# Patient Record
Sex: Female | Born: 1990 | Race: Black or African American | Hispanic: No | Marital: Single | Smoking: Never smoker
Health system: Southern US, Community
[De-identification: ages and names within clinical notes are randomized; demographics above are authoritative.]

## PROBLEM LIST (undated history)

## (undated) ENCOUNTER — Inpatient Hospital Stay (HOSPITAL_COMMUNITY): Payer: Self-pay

## (undated) DIAGNOSIS — A749 Chlamydial infection, unspecified: Secondary | ICD-10-CM

## (undated) HISTORY — PX: DILATION AND CURETTAGE OF UTERUS: SHX78

---

## 2009-08-18 DIAGNOSIS — A749 Chlamydial infection, unspecified: Secondary | ICD-10-CM

## 2009-08-18 HISTORY — DX: Chlamydial infection, unspecified: A74.9

## 2010-09-14 ENCOUNTER — Inpatient Hospital Stay (HOSPITAL_COMMUNITY)
Admission: AD | Admit: 2010-09-14 | Discharge: 2010-09-14 | Payer: Self-pay | Source: Home / Self Care | Attending: Obstetrics & Gynecology | Admitting: Obstetrics & Gynecology

## 2010-09-14 LAB — CBC
MCH: 27 pg (ref 26.0–34.0)
MCHC: 31.7 g/dL (ref 30.0–36.0)
Platelets: 217 10*3/uL (ref 150–400)
RDW: 12.4 % (ref 11.5–15.5)

## 2010-09-14 LAB — URINALYSIS, ROUTINE W REFLEX MICROSCOPIC
Ketones, ur: NEGATIVE mg/dL
Nitrite: NEGATIVE
Specific Gravity, Urine: 1.025 (ref 1.005–1.030)
Urine Glucose, Fasting: NEGATIVE mg/dL
pH: 7.5 (ref 5.0–8.0)

## 2010-09-14 LAB — HCG, QUANTITATIVE, PREGNANCY: hCG, Beta Chain, Quant, S: 53745 m[IU]/mL — ABNORMAL HIGH (ref <5)

## 2010-09-14 LAB — URINE MICROSCOPIC-ADD ON

## 2010-09-14 LAB — POCT PREGNANCY, URINE: Preg Test, Ur: POSITIVE

## 2010-09-14 LAB — ABO/RH: ABO/RH(D): A POS

## 2010-09-14 LAB — WET PREP, GENITAL: Yeast Wet Prep HPF POC: NONE SEEN

## 2010-09-16 LAB — URINE CULTURE

## 2010-09-16 LAB — GC/CHLAMYDIA PROBE AMP, GENITAL
Chlamydia, DNA Probe: NEGATIVE
GC Probe Amp, Genital: NEGATIVE

## 2010-09-23 ENCOUNTER — Inpatient Hospital Stay (HOSPITAL_COMMUNITY)
Admission: AD | Admit: 2010-09-23 | Discharge: 2010-09-23 | Disposition: A | Discharge status: Home / Self Care | Payer: Medicaid Other | Source: Ambulatory Visit | Attending: Obstetrics & Gynecology | Admitting: Obstetrics & Gynecology

## 2010-09-23 DIAGNOSIS — O9989 Other specified diseases and conditions complicating pregnancy, childbirth and the puerperium: Secondary | ICD-10-CM

## 2010-09-23 DIAGNOSIS — H6690 Otitis media, unspecified, unspecified ear: Secondary | ICD-10-CM | POA: Insufficient documentation

## 2010-09-23 DIAGNOSIS — O99891 Other specified diseases and conditions complicating pregnancy: Secondary | ICD-10-CM | POA: Insufficient documentation

## 2013-08-18 NOTE — L&D Delivery Note (Signed)
Delivery Note About 45 minutes after AROM, pt developed an urge to push, and was found to be C/C/+3. Her epidural was working well. After one push, att 8:53 PM a viable female was delivered via Vaginal, Spontaneous Delivery (Presentation: Left Occiput Anterior).  APGAR: 9, 9; weight 6 lb 4.5 oz (2849 g). 40 units of pitocin diluted in 1000cc LR was infused rapidly IV.  The placenta separated spontaneously and delivered via CCT and maternal pushing effort.  It was inspected and appears to be intact with a 3 VC.    Anesthesia: Epidural  Episiotomy: None Lacerations: None Suture Repair: n/a Est. Blood Loss (mL): 300  Mom to postpartum.  Baby to Couplet care / Skin to Skin.  CRESENZO-DISHMAN,Geordan Xu 08/15/2014, 10:29 PM

## 2014-03-19 ENCOUNTER — Encounter (HOSPITAL_COMMUNITY): Payer: Self-pay | Admitting: *Deleted

## 2014-03-19 ENCOUNTER — Inpatient Hospital Stay (HOSPITAL_COMMUNITY)
Admission: AD | Admit: 2014-03-19 | Discharge: 2014-03-19 | Disposition: A | Discharge status: Home / Self Care | Payer: Medicaid Other | Source: Ambulatory Visit | Attending: Obstetrics and Gynecology | Admitting: Obstetrics and Gynecology

## 2014-03-19 DIAGNOSIS — Z8249 Family history of ischemic heart disease and other diseases of the circulatory system: Secondary | ICD-10-CM | POA: Diagnosis not present

## 2014-03-19 DIAGNOSIS — O26892 Other specified pregnancy related conditions, second trimester: Secondary | ICD-10-CM

## 2014-03-19 DIAGNOSIS — R519 Headache, unspecified: Secondary | ICD-10-CM

## 2014-03-19 DIAGNOSIS — R51 Headache: Secondary | ICD-10-CM | POA: Diagnosis present

## 2014-03-19 DIAGNOSIS — Z833 Family history of diabetes mellitus: Secondary | ICD-10-CM | POA: Insufficient documentation

## 2014-03-19 DIAGNOSIS — O99891 Other specified diseases and conditions complicating pregnancy: Secondary | ICD-10-CM | POA: Diagnosis not present

## 2014-03-19 DIAGNOSIS — O9989 Other specified diseases and conditions complicating pregnancy, childbirth and the puerperium: Secondary | ICD-10-CM

## 2014-03-19 LAB — URINALYSIS, ROUTINE W REFLEX MICROSCOPIC
BILIRUBIN URINE: NEGATIVE
Glucose, UA: NEGATIVE mg/dL
HGB URINE DIPSTICK: NEGATIVE
Ketones, ur: NEGATIVE mg/dL
Leukocytes, UA: NEGATIVE
NITRITE: NEGATIVE
PH: 7 (ref 5.0–8.0)
Protein, ur: NEGATIVE mg/dL
SPECIFIC GRAVITY, URINE: 1.02 (ref 1.005–1.030)
Urobilinogen, UA: 1 mg/dL (ref 0.0–1.0)

## 2014-03-19 LAB — POCT PREGNANCY, URINE: PREG TEST UR: POSITIVE — AB

## 2014-03-19 NOTE — Discharge Instructions (Signed)
Take Tylenol 325 mg 2 tablets by mouth every 4 hours if needed for pain. °Drink at least 8 8-oz glasses of water every day. °No smoking, no drugs, no alcohol.  Take a prenatal vitamin one by mouth every day.  Eat small frequent snacks to avoid nausea.  Begin prenatal care as soon as possible. ° °

## 2014-03-19 NOTE — MAU Provider Note (Signed)
History     CSN: [id number]  Arrival date and time: 03/19/14 1541   First Provider Initiated Contact with Patient 03/19/14 1604      Chief Complaint  Patient presents with  . Headache  . Possible Pregnancy   HPI Alicia Craig 23 y.o. 2938w0d  Comes to MAU as she has a mild headache today.  Has been having lots of headaches recently.  Has taken tylenol or motrin for her headaches.  Hs of Irregular menses.  Had not considered that she might be pregnant.  Was surprised by the positive pregnancy test.  Headache today is mild.  Has not taken any pain medication today and has not eaten today.   OB History   Grav Para Term Preterm Abortions TAB SAB Ect Mult Living   3 2 2       2       Past Medical History  Diagnosis Date  . Medical history non-contributory     Past Surgical History  Procedure Laterality Date  . No past surgeries      Family History  Problem Relation Age of Onset  . Hypertension Mother   . Diabetes Maternal Grandmother     History  Substance Use Topics  . Smoking status: Never Smoker   . Smokeless tobacco: Not on file  . Alcohol Use: No    Allergies: Allergies not on file  No prescriptions prior to admission    Review of Systems  Constitutional: Negative for fever.  Gastrointestinal: Negative for nausea, vomiting and abdominal pain.  Genitourinary:       No vaginal discharge. No vaginal bleeding. No dysuria.  Neurological: Positive for headaches.   Physical Exam   Blood pressure 125/64, pulse 91, temperature 98.3 F (36.8 C), temperature source Oral, resp. rate 18, height 5\' 2"  (1.575 m), weight 170 lb (77.111 kg), last menstrual period 10/30/2013.  Physical Exam  Nursing note and vitals reviewed. Constitutional: She is oriented to person, place, and time. She appears well-developed and well-nourished.  HENT:  Head: Normocephalic.  Eyes: EOM are normal.  Neck: Neck supple.  GI: Soft. There is no tenderness.  Fundus just below the  umbilicus.  FHT 150 with the doppler.  Musculoskeletal: Normal range of motion.  Neurological: She is alert and oriented to person, place, and time.  Skin: Skin is warm and dry.  Psychiatric: She has a normal mood and affect.    MAU Course  Procedures Results for orders placed during the hospital encounter of 03/19/14 (from the past 24 hour(s))  URINALYSIS, ROUTINE W REFLEX MICROSCOPIC     Status: None   Collection Time    03/19/14  3:40 PM      Result Value Ref Range   Color, Urine YELLOW  YELLOW   APPearance CLEAR  CLEAR   Specific Gravity, Urine 1.020  1.005 - 1.030   pH 7.0  5.0 - 8.0   Glucose, UA NEGATIVE  NEGATIVE mg/dL   Hgb urine dipstick NEGATIVE  NEGATIVE   Bilirubin Urine NEGATIVE  NEGATIVE   Ketones, ur NEGATIVE  NEGATIVE mg/dL   Protein, ur NEGATIVE  NEGATIVE mg/dL   Urobilinogen, UA 1.0  0.0 - 1.0 mg/dL   Nitrite NEGATIVE  NEGATIVE   Leukocytes, UA NEGATIVE  NEGATIVE  POCT PREGNANCY, URINE     Status: Abnormal   Collection Time    03/19/14  3:50 PM      Result Value Ref Range   Preg Test, Ur POSITIVE (*) NEGATIVE  MDM Declines pain medication today.  Will take tylenol at home.  States she was approved for Medicaid but does not have her card.  Wants to go to a private doctor.  Advised to get card and call for an appointment.  Assessment and Plan  Headaches in pregnancy pregnancy 19-20 weeks  Plan Take Tylenol 325 mg 2 tablets by mouth every 4 hours if needed for pain. Drink at least 8 8-oz glasses of water every day. No smoking, no drugs, no alcohol.  Take a prenatal vitamin one by mouth every day.  Eat small frequent snacks to avoid nausea.  Begin prenatal care as soon as possible.    Raylan Hanton 03/19/2014, 4:15 PM

## 2014-03-19 NOTE — MAU Note (Signed)
C/o hot flashes and headache today; headache & hot flashes have been intermittent for past 2 weeks; unsure of LNMP; positive UPT today;

## 2014-03-19 NOTE — MAU Provider Note (Signed)
Attestation of Attending Supervision of Advanced Practitioner (CNM/NP): Evaluation and management procedures were performed by the Advanced Practitioner under my supervision and collaboration.  I have reviewed the Advanced Practitioner's note and chart, and I agree with the management and plan.  Mayumi Summerson 03/19/2014 8:15 PM

## 2014-04-20 ENCOUNTER — Other Ambulatory Visit (HOSPITAL_COMMUNITY): Payer: Self-pay | Admitting: Urology

## 2014-04-20 DIAGNOSIS — Z3689 Encounter for other specified antenatal screening: Secondary | ICD-10-CM

## 2014-04-20 LAB — OB RESULTS CONSOLE HEPATITIS B SURFACE ANTIGEN: Hepatitis B Surface Ag: NEGATIVE

## 2014-04-20 LAB — OB RESULTS CONSOLE GC/CHLAMYDIA
Chlamydia: NEGATIVE
GC PROBE AMP, GENITAL: NEGATIVE

## 2014-04-20 LAB — OB RESULTS CONSOLE HIV ANTIBODY (ROUTINE TESTING): HIV: NONREACTIVE

## 2014-04-20 LAB — OB RESULTS CONSOLE RPR: RPR: NONREACTIVE

## 2014-04-20 LAB — OB RESULTS CONSOLE ABO/RH: RH Type: POSITIVE

## 2014-04-20 LAB — OB RESULTS CONSOLE ANTIBODY SCREEN: ANTIBODY SCREEN: NEGATIVE

## 2014-04-20 LAB — OB RESULTS CONSOLE RUBELLA ANTIBODY, IGM: RUBELLA: IMMUNE

## 2014-04-27 ENCOUNTER — Ambulatory Visit (HOSPITAL_COMMUNITY)
Admission: RE | Admit: 2014-04-27 | Discharge: 2014-04-27 | Disposition: A | Discharge status: Home / Self Care | Payer: Medicaid Other | Source: Ambulatory Visit | Attending: Urology | Admitting: Urology

## 2014-04-27 ENCOUNTER — Encounter (HOSPITAL_COMMUNITY): Payer: Self-pay

## 2014-04-27 ENCOUNTER — Ambulatory Visit (HOSPITAL_COMMUNITY): Admission: RE | Admit: 2014-04-27 | Payer: Medicaid Other | Source: Ambulatory Visit

## 2014-04-27 ENCOUNTER — Ambulatory Visit (HOSPITAL_COMMUNITY)
Admission: RE | Admit: 2014-04-27 | Discharge: 2014-04-27 | Disposition: A | Discharge status: Home / Self Care | Payer: Medicaid Other | Source: Ambulatory Visit | Attending: Physician Assistant | Admitting: Physician Assistant

## 2014-04-27 VITALS — BP 106/57 | HR 89 | Wt 199.0 lb

## 2014-04-27 DIAGNOSIS — O0930 Supervision of pregnancy with insufficient antenatal care, unspecified trimester: Secondary | ICD-10-CM | POA: Diagnosis not present

## 2014-04-27 DIAGNOSIS — O352XX0 Maternal care for (suspected) hereditary disease in fetus, not applicable or unspecified: Secondary | ICD-10-CM | POA: Insufficient documentation

## 2014-04-27 DIAGNOSIS — O09299 Supervision of pregnancy with other poor reproductive or obstetric history, unspecified trimester: Secondary | ICD-10-CM

## 2014-04-27 DIAGNOSIS — O0932 Supervision of pregnancy with insufficient antenatal care, second trimester: Secondary | ICD-10-CM

## 2014-04-27 DIAGNOSIS — Z3689 Encounter for other specified antenatal screening: Secondary | ICD-10-CM

## 2014-04-27 DIAGNOSIS — O352XX1 Maternal care for (suspected) hereditary disease in fetus, fetus 1: Secondary | ICD-10-CM

## 2014-05-09 ENCOUNTER — Encounter (HOSPITAL_COMMUNITY): Payer: Self-pay

## 2014-05-09 DIAGNOSIS — O09299 Supervision of pregnancy with other poor reproductive or obstetric history, unspecified trimester: Secondary | ICD-10-CM | POA: Insufficient documentation

## 2014-05-09 NOTE — Progress Notes (Addendum)
Genetic Counseling  High-Risk Gestation Note  Appointment Date:  04/27/2014 Referred By: Quentin Mulling, PA-C Date of Birth:  09/13/1990    Pregnancy History: [id number] Estimated Date of Delivery: 08/18/14 Estimated Gestational Age: [redacted]w[redacted]d Attending: Alpha Gula, MD   I met with Ms. Ernestina Penna for genetic counseling because of a previous daughter with Down syndrome.   We began by reviewing the family history in detail. Ms. CZARINA GINGRAS reported that she has a 38 year old daughter who has Down syndrome. She reported that her daughter was low birth weight and was found to have an atrial septal defect and patent ductus arteriosus postnatally. Her daughter was born at [facility] and was diagnosed postnatally with Down syndrome. She reported that her daughter is currently followed by [facility] and has surgery scheduled for 05/01/14. The patient reported that she has not seen medical genetics since moving to this area. We provided Ms. Mcglamery with information regarding local Down syndrome support group through the [employer] [facility] [place] and discussed that a referral to medical genetics could be made by her daughter's pediatrician, if desired. We also provided Ms. Cumbee with written information regarding Down syndrome from the National Down syndrome Congress.   Ms. Murfin did not have information today regarding the specific type of Down syndrome determined from karyotype analysis.  She gave [place] written permission to obtain and review her daughter's medical records to confirm the type of Down syndrome in order to more accurately assess recurrence risk for the current pregnancy. Medical records request is currently pending.   We reviewed chromosomes, nondisjunction, and the associated 1 in 890 risk for fetal Down syndrome related to a maternal age of 23 y.o. at [redacted]w[redacted]d gestation. We discussed that 95% of cases of Down syndrome are not  inherited and are the result of non-disjunction.  Three to 4% of cases of Down syndrome are the result of a translocation involving chromosome #21.  We discussed that information regarding the etiology of her daughter's Down syndrome is needed to accurately assess recurrence risk for the current pregnancy. In the case that her daughter has trisomy 7, we reviewed that literature suggests that the risk of recurrence for a homo/heterotrisomy depends on the maternal age at the index pregnancy, the maternal age at the time of the subsequent pregnancy, and the specific trisomy [place] 2004).  Considering Ms. Ramsay's age at delivery for her daughter and her current age, her adjusted risk for Down syndrome (homotrisomy) in the current pregnancy would be approximately 1 in 80 (in the case that her daughter has trisomy 69).  The overall risk for any aneuploidy, considering Ms. Kujawa's maternal age of 23 y.o. and her history of a previous child with Down syndrome, is estimated to be approximately 2%, in the case that her daughter has trisomy 25. If her daughter has translocation Down syndrome, recurrence risk would depend upon the specific chromosomes involved in the translocation. Additional information regarding her daughter's karyotype will further refine recurrence risk assessment for the current pregnancy.    We reviewed available screening options including noninvasive prenatal screening (NIPS)/cell free DNA (cfDNA) and detailed ultrasound.  We reviewed the benefits and limitations of these tests including the detection rates, false positive rates, and the specific conditions for which each screens.  She understands that screening tests provide a pregnancy specific risk for specific conditions, but are not considered to be diagnostic. We reviewed the detailed ultrasound that was performed at the time  of today's visit. Visualized fetal anatomy appeared normal, and no markers of fetal aneuploidy were  visualized at this time. Complete ultrasound results reported separately.  Ms. Guin was also counseled regarding diagnostic testing via amniocentesis.  We reviewed the approximate 1 in [phone] risk for complications, including spontaneous pregnancy loss. After careful consideration, Ms. Dasher declined NIPS and amniocentesis in the pregnancy.   The family histories were otherwise found to be noncontributory for birth defects, intellectual disability, and known genetic conditions. Without further information regarding the provided family history, an accurate genetic risk cannot be calculated. Further genetic counseling is warranted if more information is obtained.   Ms. EDESSA JAKUBOWICZ denied exposure to environmental toxins or chemical agents. She denied the use of alcohol, tobacco or street drugs. She denied significant viral illnesses during the course of her pregnancy. Her medical and surgical histories were noncontributory.   I counseled Ms. Nicola Girt Oleary regarding the above risks and available options.  The approximate face-to-face time with the genetic counselor was 40 minutes.  Chipper Oman, MS Certified Genetic Counselor 05/09/2014    Addendum: 05/11/2014   Received follow-up information from the medical records for the patient's daughter, Lilia Pro. Documentation from Morgan's last genetic visit with [facility] states:  "Lilia Pro is a 52 week old female infant who is being seen in genetics clinic as follow-up from an inpatient consult because of features sggestive of Down syndrome. 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 This form of Down syndrome can be caused by one parent having a Robertsonian translocation. If one parent does have this translocaiton, that parent would only be able to have children with Down syndrome or miscarriages. Since Morgan's mother has another child withoutt Down syndrome, she would not  be a carrier. We would recommend that the biologic father have a chromosome karyotype."  Given this reported information and given that the current pregnancy reportedly has a different father from [place], the pregnancy would not be expected to be at increased risk for Down syndrome above the patient's age related risk.   Chipper Oman, MS Insurance risk surveyor

## 2014-06-19 ENCOUNTER — Encounter (HOSPITAL_COMMUNITY): Payer: Self-pay

## 2014-06-26 ENCOUNTER — Encounter (HOSPITAL_COMMUNITY): Payer: Self-pay | Admitting: *Deleted

## 2014-06-26 ENCOUNTER — Inpatient Hospital Stay (HOSPITAL_COMMUNITY)
Admission: AD | Admit: 2014-06-26 | Discharge: 2014-06-26 | Disposition: A | Discharge status: Home / Self Care | Payer: Medicaid Other | Source: Ambulatory Visit | Attending: Family Medicine | Admitting: Family Medicine

## 2014-06-26 DIAGNOSIS — R109 Unspecified abdominal pain: Secondary | ICD-10-CM | POA: Diagnosis present

## 2014-06-26 DIAGNOSIS — O479 False labor, unspecified: Secondary | ICD-10-CM

## 2014-06-26 DIAGNOSIS — Z3A32 32 weeks gestation of pregnancy: Secondary | ICD-10-CM | POA: Diagnosis not present

## 2014-06-26 DIAGNOSIS — O4703 False labor before 37 completed weeks of gestation, third trimester: Secondary | ICD-10-CM | POA: Insufficient documentation

## 2014-06-26 DIAGNOSIS — K5900 Constipation, unspecified: Secondary | ICD-10-CM | POA: Diagnosis not present

## 2014-06-26 LAB — URINALYSIS, ROUTINE W REFLEX MICROSCOPIC
Bilirubin Urine: NEGATIVE
GLUCOSE, UA: NEGATIVE mg/dL
Hgb urine dipstick: NEGATIVE
Ketones, ur: NEGATIVE mg/dL
LEUKOCYTES UA: NEGATIVE
NITRITE: NEGATIVE
PH: 8.5 — AB (ref 5.0–8.0)
PROTEIN: NEGATIVE mg/dL
Specific Gravity, Urine: 1.015 (ref 1.005–1.030)
Urobilinogen, UA: 1 mg/dL (ref 0.0–1.0)

## 2014-06-26 MED ORDER — SENNOSIDES-DOCUSATE SODIUM 8.6-50 MG PO TABS: 2.0000 | ORAL_TABLET | Freq: Two times a day (BID) | ORAL | Status: DC | PRN

## 2014-06-26 NOTE — MAU Provider Note (Signed)
Chief Complaint:  Abdominal Pain; Constipation; and Vaginal Pain   First Provider Initiated Contact with Patient 06/26/14 1505      HPI: Alicia Craig is a 23 y.o. G3P2002 at 4080w3d who presents to maternity admissions reporting constipation and contractions.  Pt states she has not had a BM in almost 4 days and reports discomfort in abdomen. Reports discomfort mainly in upper abomen, but occasionally in middle of abdomen. Mom attempted to give fleets enema, but reports "water cam right now." Not taking anything else for constipation.  Also reports more frequent Braxton Hicks contractions, several times a day. Reports pain like "regular contractions" with this. Reports worse at work, where she works at [employer] as a Conservation officer, naturecashier. Mom reports she drinks < 16 oz water/day.  Denies contractions, leakage of fluid or vaginal bleeding. Good fetal movement.   Pregnancy Course:  None  Past Medical History: Past Medical History  Diagnosis Date  . Medical history non-contributory     Past obstetric history: OB History  Gravida Para Term Preterm AB SAB TAB Ectopic Multiple Living  3 2 2       2     # Outcome Date GA Lbr Len/2nd Weight Sex Delivery Anes PTL Lv  3 Current           2 Term           1 Term               Past Surgical History: Past Surgical History  Procedure Laterality Date  . No past surgeries       Family History: Family History  Problem Relation Age of Onset  . Hypertension Mother   . Diabetes Maternal Grandmother   . Down syndrome Daughter     Social History: History  Substance Use Topics  . Smoking status: Never Smoker   . Smokeless tobacco: Not on file  . Alcohol Use: No    Allergies: No Known Allergies  Meds:  No prescriptions prior to admission    ROS: Pertinent findings in history of present illness.  Physical Exam  Blood pressure 130/68, pulse 81, temperature 98.5 F (36.9 C), temperature source Oral, resp. rate 18, height 5\' 2"  (1.575 m),  weight 190 lb 12.8 oz (86.546 kg), last menstrual period 11/11/2013, SpO2 99 %. GENERAL: Well-developed, well-nourished female in no acute distress.  HEENT: normocephalic HEART: normal rate RESP: normal effort ABDOMEN: Soft, non-tender, gravid appropriate for gestational age EXTREMITIES: Nontender, no edema NEURO: alert and oriented SPECULUM EXAM: NEFG, physiologic discharge, no blood, cervix clean Dilation: Fingertip Effacement (%): Thick Station: Ballotable Exam by:: Dr. Woody Seller. Jayin Derousse  FHT:  Baseline 135 , moderate variability, accelerations present, no decelerations Contractions: quiet   Labs: Results for orders placed or performed during the hospital encounter of 06/26/14 (from the past 24 hour(s))  Urinalysis, Routine w reflex microscopic     Status: Abnormal   Collection Time: 06/26/14  1:40 PM  Result Value Ref Range   Color, Urine YELLOW YELLOW   APPearance CLEAR CLEAR   Specific Gravity, Urine 1.015 1.005 - 1.030   pH 8.5 (H) 5.0 - 8.0   Glucose, UA NEGATIVE NEGATIVE mg/dL   Hgb urine dipstick NEGATIVE NEGATIVE   Bilirubin Urine NEGATIVE NEGATIVE   Ketones, ur NEGATIVE NEGATIVE mg/dL   Protein, ur NEGATIVE NEGATIVE mg/dL   Urobilinogen, UA 1.0 0.0 - 1.0 mg/dL   Nitrite NEGATIVE NEGATIVE   Leukocytes, UA NEGATIVE NEGATIVE   Wet prep: No yeast, BV, trich  Imaging:  No results found.  MAU Course: Wet prep neg GC/Ct collected and sent  Assessment: 1. Constipation, unspecified constipation type   2. Braxton Hick's contraction     Plan: Not in active labor and stable to d/c home. No evidence of vaginal infection. Likely constipation and BH contractions related to dehydration, advised increasing water intake.  Discharge home Labor precautions and fetal kick counts Senna-Colace BID for constipation w/ miralax prn.     Follow-up Information    Schedule an appointment as soon as possible for a visit in 2 days to follow up.   Why:  If symptoms worsen        Medication List    TAKE these medications        acetaminophen 325 MG tablet  Commonly known as:  TYLENOL  Take 650 mg by mouth 2 (two) times daily as needed for headache.     PRENATAL VITAMIN PO  Take by mouth.     senna-docusate 8.6-50 MG per tablet  Commonly known as:  Senokot-S  Take 2 tablets by mouth 2 (two) times daily as needed for mild constipation.        Ethelda Chickaroline Trustin Chapa, MD 06/26/2014 5:29 PM

## 2014-06-26 NOTE — Discharge Instructions (Signed)
Braxton Hicks Contractions °Contractions of the uterus can occur throughout pregnancy. Contractions are not always a sign that you are in labor.  °WHAT ARE BRAXTON HICKS CONTRACTIONS?  °Contractions that occur before labor are called Braxton Hicks contractions, or false labor. Toward the end of pregnancy (32-34 weeks), these contractions can develop more often and may become more forceful. This is not true labor because these contractions do not result in opening (dilatation) and thinning of the cervix. They are sometimes difficult to tell apart from true labor because these contractions can be forceful and people have different pain tolerances. You should not feel embarrassed if you go to the hospital with false labor. Sometimes, the only way to tell if you are in true labor is for your health care provider to look for changes in the cervix. °If there are no prenatal problems or other health problems associated with the pregnancy, it is completely safe to be sent home with false labor and await the onset of true labor. °HOW CAN YOU TELL THE DIFFERENCE BETWEEN TRUE AND FALSE LABOR? °False Labor °· The contractions of false labor are usually shorter and not as hard as those of true labor.   °· The contractions are usually irregular.   °· The contractions are often felt in the front of the lower abdomen and in the groin.   °· The contractions may go away when you walk around or change positions while lying down.   °· The contractions get weaker and are shorter lasting as time goes on.   °· The contractions do not usually become progressively stronger, regular, and closer together as with true labor.   °True Labor °· Contractions in true labor last 30-70 seconds, become very regular, usually become more intense, and increase in frequency.   °· The contractions do not go away with walking.   °· The discomfort is usually felt in the top of the uterus and spreads to the lower abdomen and low back.   °· True labor can be  determined by your health care provider with an exam. This will show that the cervix is dilating and getting thinner.   °WHAT TO REMEMBER °· Keep up with your usual exercises and follow other instructions given by your health care provider.   °· Take medicines as directed by your health care provider.   °· Keep your regular prenatal appointments.   °· Eat and drink lightly if you think you are going into labor.   °· If Braxton Hicks contractions are making you uncomfortable:   °¨ Change your position from lying down or resting to walking, or from walking to resting.   °¨ Sit and rest in a tub of warm water.   °¨ Drink 2-3 glasses of water. Dehydration may cause these contractions.   °¨ Do slow and deep breathing several times an hour.   °WHEN SHOULD I SEEK IMMEDIATE MEDICAL CARE? °Seek immediate medical care if: °· Your contractions become stronger, more regular, and closer together.   °· You have fluid leaking or gushing from your vagina.   °· You have a fever.   °· You pass blood-tinged mucus.   °· You have vaginal bleeding.   °· You have continuous abdominal pain.   °· You have low back pain that you never had before.   °· You feel your baby's head pushing down and causing pelvic pressure.   °· Your baby is not moving as much as it used to.   °Document Released: 08/04/2005 Document Revised: 08/09/2013 Document Reviewed: 05/16/2013 °Exit[employer]® Patient Information ©2015 [employer]. This information is not intended to replace advice given to you by your health care   provider. Make sure you discuss any questions you have with your health care provider. ° °

## 2014-06-26 NOTE — Progress Notes (Signed)
Patient's mother insistant that patient be given something here for constipation. Informed that MD will be in to speak with them.

## 2014-06-26 NOTE — MAU Note (Signed)
Patient states she has been having abdominal pain for about one week at the mid area. Has had vaginal pain and pressure since 30 weeks. States she has not had a BM in 3 days. Reports good fetal movement, no bleeding or leaking.

## 2014-06-26 NOTE — Progress Notes (Signed)
States her mother gave her a fleets enema, but she went to the BR right away and passed the fluid from the enema back out. Very little stool.

## 2014-06-27 LAB — GC/CHLAMYDIA PROBE AMP
CT PROBE, AMP APTIMA: NEGATIVE
GC Probe RNA: NEGATIVE

## 2014-07-27 LAB — OB RESULTS CONSOLE GBS: STREP GROUP B AG: NEGATIVE

## 2014-08-03 ENCOUNTER — Other Ambulatory Visit (HOSPITAL_COMMUNITY): Payer: Self-pay | Admitting: Nurse Practitioner

## 2014-08-03 DIAGNOSIS — O365930 Maternal care for other known or suspected poor fetal growth, third trimester, not applicable or unspecified: Secondary | ICD-10-CM

## 2014-08-04 ENCOUNTER — Ambulatory Visit (HOSPITAL_COMMUNITY): Admission: RE | Admit: 2014-08-04 | Payer: Medicaid Other | Source: Ambulatory Visit

## 2014-08-11 ENCOUNTER — Inpatient Hospital Stay (HOSPITAL_COMMUNITY)
Admission: AD | Admit: 2014-08-11 | Discharge: 2014-08-11 | Disposition: A | Discharge status: Home / Self Care | Payer: Medicaid Other | Source: Ambulatory Visit | Attending: Obstetrics & Gynecology | Admitting: Obstetrics & Gynecology

## 2014-08-11 ENCOUNTER — Encounter (HOSPITAL_COMMUNITY): Payer: Self-pay | Admitting: General Practice

## 2014-08-11 DIAGNOSIS — O471 False labor at or after 37 completed weeks of gestation: Secondary | ICD-10-CM | POA: Diagnosis not present

## 2014-08-11 DIAGNOSIS — Z3A39 39 weeks gestation of pregnancy: Secondary | ICD-10-CM | POA: Insufficient documentation

## 2014-08-11 NOTE — MAU Note (Signed)
Patient states she is having contractions every 15 minutes. Denies bleeding or leaking. Reports fetal movement but less than usual.

## 2014-08-11 NOTE — Discharge Instructions (Signed)
Braxton Hicks Contractions °Contractions of the uterus can occur throughout pregnancy. Contractions are not always a sign that you are in labor.  °WHAT ARE BRAXTON HICKS CONTRACTIONS?  °Contractions that occur before labor are called Braxton Hicks contractions, or false labor. Toward the end of pregnancy (32-34 weeks), these contractions can develop more often and may become more forceful. This is not true labor because these contractions do not result in opening (dilatation) and thinning of the cervix. They are sometimes difficult to tell apart from true labor because these contractions can be forceful and people have different pain tolerances. You should not feel embarrassed if you go to the hospital with false labor. Sometimes, the only way to tell if you are in true labor is for your health care provider to look for changes in the cervix. °If there are no prenatal problems or other health problems associated with the pregnancy, it is completely safe to be sent home with false labor and await the onset of true labor. °HOW CAN YOU TELL THE DIFFERENCE BETWEEN TRUE AND FALSE LABOR? °False Labor °· The contractions of false labor are usually shorter and not as hard as those of true labor.   °· The contractions are usually irregular.   °· The contractions are often felt in the front of the lower abdomen and in the groin.   °· The contractions may go away when you walk around or change positions while lying down.   °· The contractions get weaker and are shorter lasting as time goes on.   °· The contractions do not usually become progressively stronger, regular, and closer together as with true labor.   °True Labor °· Contractions in true labor last 30-70 seconds, become very regular, usually become more intense, and increase in frequency.   °· The contractions do not go away with walking.   °· The discomfort is usually felt in the top of the uterus and spreads to the lower abdomen and low back.   °· True labor can be  determined by your health care provider with an exam. This will show that the cervix is dilating and getting thinner.   °WHAT TO REMEMBER °· Keep up with your usual exercises and follow other instructions given by your health care provider.   °· Take medicines as directed by your health care provider.   °· Keep your regular prenatal appointments.   °· Eat and drink lightly if you think you are going into labor.   °· If Braxton Hicks contractions are making you uncomfortable:   °¨ Change your position from lying down or resting to walking, or from walking to resting.   °¨ Sit and rest in a tub of warm water.   °¨ Drink 2-3 glasses of water. Dehydration may cause these contractions.   °¨ Do slow and deep breathing several times an hour.   °WHEN SHOULD I SEEK IMMEDIATE MEDICAL CARE? °Seek immediate medical care if: °· Your contractions become stronger, more regular, and closer together.   °· You have fluid leaking or gushing from your vagina.   °· You have a fever.   °· You pass blood-tinged mucus.   °· You have vaginal bleeding.   °· You have continuous abdominal pain.   °· You have low back pain that you never had before.   °· You feel your baby's head pushing down and causing pelvic pressure.   °· Your baby is not moving as much as it used to.   °Document Released: 08/04/2005 Document Revised: 08/09/2013 Document Reviewed: 05/16/2013 °Exit[employer]® Patient Information ©2015 [employer]. This information is not intended to replace advice given to you by your health care   provider. Make sure you discuss any questions you have with your health care provider. ° °

## 2014-08-15 ENCOUNTER — Inpatient Hospital Stay (HOSPITAL_COMMUNITY): Payer: Medicaid Other | Admitting: Anesthesiology

## 2014-08-15 ENCOUNTER — Encounter (HOSPITAL_COMMUNITY): Payer: Self-pay | Admitting: *Deleted

## 2014-08-15 ENCOUNTER — Inpatient Hospital Stay (HOSPITAL_COMMUNITY)
Admission: AD | Admit: 2014-08-15 | Discharge: 2014-08-17 | DRG: 775 | Disposition: A | Discharge status: Home / Self Care | Payer: Medicaid Other | Source: Ambulatory Visit | Attending: Family Medicine | Admitting: Family Medicine

## 2014-08-15 DIAGNOSIS — Z8249 Family history of ischemic heart disease and other diseases of the circulatory system: Secondary | ICD-10-CM

## 2014-08-15 DIAGNOSIS — Z8279 Family history of other congenital malformations, deformations and chromosomal abnormalities: Secondary | ICD-10-CM

## 2014-08-15 DIAGNOSIS — IMO0001 Reserved for inherently not codable concepts without codable children: Secondary | ICD-10-CM

## 2014-08-15 DIAGNOSIS — Z3A39 39 weeks gestation of pregnancy: Secondary | ICD-10-CM | POA: Diagnosis present

## 2014-08-15 DIAGNOSIS — Z833 Family history of diabetes mellitus: Secondary | ICD-10-CM

## 2014-08-15 HISTORY — DX: Chlamydial infection, unspecified: A74.9

## 2014-08-15 LAB — TYPE AND SCREEN
ABO/RH(D): A POS
ANTIBODY SCREEN: NEGATIVE

## 2014-08-15 LAB — CBC
HCT: 33.8 % — ABNORMAL LOW (ref 36.0–46.0)
Hemoglobin: 10.6 g/dL — ABNORMAL LOW (ref 12.0–15.0)
MCH: 27 pg (ref 26.0–34.0)
MCHC: 31.4 g/dL (ref 30.0–36.0)
MCV: 86 fL (ref 78.0–100.0)
PLATELETS: 224 10*3/uL (ref 150–400)
RBC: 3.93 MIL/uL (ref 3.87–5.11)
RDW: 13.4 % (ref 11.5–15.5)
WBC: 10.5 10*3/uL (ref 4.0–10.5)

## 2014-08-15 MED ORDER — EPHEDRINE 5 MG/ML INJ: 10.0000 mg | INTRAVENOUS | Status: DC | PRN

## 2014-08-15 MED ORDER — LANOLIN HYDROUS EX OINT: TOPICAL_OINTMENT | CUTANEOUS | Status: DC | PRN

## 2014-08-15 MED ORDER — LACTATED RINGERS IV SOLN: 500.0000 mL | INTRAVENOUS | Status: DC | PRN

## 2014-08-15 MED ORDER — FENTANYL 2.5 MCG/ML BUPIVACAINE 1/10 % EPIDURAL INFUSION (WH - ANES)
14.0000 mL/h | INTRAMUSCULAR | Status: DC | PRN
Start: 2014-08-15 — End: 2014-08-15
  Administered 2014-08-15: 14 mL/h via EPIDURAL
  Filled 2014-08-15: qty 125

## 2014-08-15 MED ORDER — MEASLES, MUMPS & RUBELLA VAC ~~LOC~~ INJ
0.5000 mL | INJECTION | Freq: Once | SUBCUTANEOUS | Status: DC
  Filled 2014-08-15: qty 0.5

## 2014-08-15 MED ORDER — SIMETHICONE 80 MG PO CHEW: 80.0000 mg | CHEWABLE_TABLET | ORAL | Status: DC | PRN

## 2014-08-15 MED ORDER — EPHEDRINE 5 MG/ML INJ
10.0000 mg | INTRAVENOUS | Status: DC | PRN
  Filled 2014-08-15: qty 2

## 2014-08-15 MED ORDER — ONDANSETRON HCL 4 MG/2ML IJ SOLN: 4.0000 mg | Freq: Four times a day (QID) | INTRAMUSCULAR | Status: DC | PRN

## 2014-08-15 MED ORDER — OXYTOCIN BOLUS FROM INFUSION
500.0000 mL | INTRAVENOUS | Status: DC
  Administered 2014-08-15: 500 mL via INTRAVENOUS

## 2014-08-15 MED ORDER — FENTANYL CITRATE 0.05 MG/ML IJ SOLN
100.0000 ug | Freq: Once | INTRAMUSCULAR | Status: AC
  Administered 2014-08-15: 100 ug via INTRAVENOUS
  Filled 2014-08-15: qty 2

## 2014-08-15 MED ORDER — PHENYLEPHRINE 40 MCG/ML (10ML) SYRINGE FOR IV PUSH (FOR BLOOD PRESSURE SUPPORT)
80.0000 ug | PREFILLED_SYRINGE | INTRAVENOUS | Status: DC | PRN
  Filled 2014-08-15: qty 10
  Filled 2014-08-15: qty 2

## 2014-08-15 MED ORDER — OXYTOCIN BOLUS FROM INFUSION: 500.0000 mL | INTRAVENOUS | Status: DC

## 2014-08-15 MED ORDER — LIDOCAINE HCL (PF) 1 % IJ SOLN
30.0000 mL | INTRAMUSCULAR | Status: DC | PRN
  Filled 2014-08-15: qty 30

## 2014-08-15 MED ORDER — IBUPROFEN 600 MG PO TABS
600.0000 mg | ORAL_TABLET | Freq: Four times a day (QID) | ORAL | Status: DC
  Administered 2014-08-16 – 2014-08-17 (×6): 600 mg via ORAL
  Filled 2014-08-15 (×6): qty 1

## 2014-08-15 MED ORDER — LACTATED RINGERS IV BOLUS (SEPSIS)
1000.0000 mL | INTRAVENOUS | Status: DC
  Administered 2014-08-15: 1000 mL via INTRAVENOUS

## 2014-08-15 MED ORDER — SENNOSIDES-DOCUSATE SODIUM 8.6-50 MG PO TABS
2.0000 | ORAL_TABLET | ORAL | Status: DC
  Administered 2014-08-16 (×2): 2 via ORAL
  Filled 2014-08-15 (×2): qty 2

## 2014-08-15 MED ORDER — FLEET ENEMA 7-19 GM/118ML RE ENEM: 1.0000 | ENEMA | RECTAL | Status: DC | PRN

## 2014-08-15 MED ORDER — OXYCODONE-ACETAMINOPHEN 5-325 MG PO TABS
2.0000 | ORAL_TABLET | ORAL | Status: DC | PRN
  Administered 2014-08-16 – 2014-08-17 (×2): 2 via ORAL
  Filled 2014-08-15 (×2): qty 2

## 2014-08-15 MED ORDER — OXYCODONE-ACETAMINOPHEN 5-325 MG PO TABS: 1.0000 | ORAL_TABLET | ORAL | Status: DC | PRN

## 2014-08-15 MED ORDER — LACTATED RINGERS IV SOLN: INTRAVENOUS | Status: DC

## 2014-08-15 MED ORDER — DIPHENHYDRAMINE HCL 25 MG PO CAPS: 25.0000 mg | ORAL_CAPSULE | Freq: Four times a day (QID) | ORAL | Status: DC | PRN

## 2014-08-15 MED ORDER — LIDOCAINE HCL (PF) 1 % IJ SOLN
INTRAMUSCULAR | Status: DC | PRN
  Administered 2014-08-15: 6 mL
  Administered 2014-08-15: 4 mL

## 2014-08-15 MED ORDER — OXYCODONE-ACETAMINOPHEN 5-325 MG PO TABS
1.0000 | ORAL_TABLET | ORAL | Status: DC | PRN
Start: 2014-08-15 — End: 2014-08-15

## 2014-08-15 MED ORDER — OXYTOCIN 40 UNITS IN LACTATED RINGERS INFUSION - SIMPLE MED
62.5000 mL/h | INTRAVENOUS | Status: DC
  Filled 2014-08-15: qty 1000

## 2014-08-15 MED ORDER — ACETAMINOPHEN 325 MG PO TABS
650.0000 mg | ORAL_TABLET | ORAL | Status: DC | PRN
Start: 2014-08-15 — End: 2014-08-15

## 2014-08-15 MED ORDER — OXYCODONE-ACETAMINOPHEN 5-325 MG PO TABS
2.0000 | ORAL_TABLET | ORAL | Status: DC | PRN
Start: 2014-08-15 — End: 2014-08-15

## 2014-08-15 MED ORDER — ONDANSETRON HCL 4 MG PO TABS: 4.0000 mg | ORAL_TABLET | ORAL | Status: DC | PRN

## 2014-08-15 MED ORDER — FLEET ENEMA 7-19 GM/118ML RE ENEM: 1.0000 | ENEMA | Freq: Every day | RECTAL | Status: DC | PRN

## 2014-08-15 MED ORDER — DIPHENHYDRAMINE HCL 50 MG/ML IJ SOLN: 12.5000 mg | INTRAMUSCULAR | Status: DC | PRN

## 2014-08-15 MED ORDER — CITRIC ACID-SODIUM CITRATE 334-500 MG/5ML PO SOLN: 30.0000 mL | ORAL | Status: DC | PRN

## 2014-08-15 MED ORDER — ACETAMINOPHEN 325 MG PO TABS: 650.0000 mg | ORAL_TABLET | ORAL | Status: DC | PRN

## 2014-08-15 MED ORDER — METHYLERGONOVINE MALEATE 0.2 MG/ML IJ SOLN: 0.2000 mg | INTRAMUSCULAR | Status: DC | PRN

## 2014-08-15 MED ORDER — BENZOCAINE-MENTHOL 20-0.5 % EX AERO
1.0000 "application " | INHALATION_SPRAY | CUTANEOUS | Status: DC | PRN
  Administered 2014-08-16: 1 via TOPICAL
  Filled 2014-08-15: qty 56

## 2014-08-15 MED ORDER — FLEET ENEMA 7-19 GM/118ML RE ENEM
1.0000 | ENEMA | Freq: Every day | RECTAL | Status: DC | PRN
Start: 2014-08-15 — End: 2014-08-15

## 2014-08-15 MED ORDER — OXYTOCIN 40 UNITS IN LACTATED RINGERS INFUSION - SIMPLE MED: 62.5000 mL/h | INTRAVENOUS | Status: DC

## 2014-08-15 MED ORDER — IBUPROFEN 600 MG PO TABS
600.0000 mg | ORAL_TABLET | Freq: Once | ORAL | Status: AC
  Administered 2014-08-15: 600 mg via ORAL
  Filled 2014-08-15: qty 1

## 2014-08-15 MED ORDER — WITCH HAZEL-GLYCERIN EX PADS
1.0000 "application " | MEDICATED_PAD | CUTANEOUS | Status: DC | PRN
  Administered 2014-08-16: 1 via TOPICAL

## 2014-08-15 MED ORDER — ZOLPIDEM TARTRATE 5 MG PO TABS: 5.0000 mg | ORAL_TABLET | Freq: Every evening | ORAL | Status: DC | PRN

## 2014-08-15 MED ORDER — OXYCODONE-ACETAMINOPHEN 5-325 MG PO TABS
1.0000 | ORAL_TABLET | ORAL | Status: DC | PRN
  Administered 2014-08-16 (×2): 1 via ORAL
  Filled 2014-08-15 (×2): qty 1

## 2014-08-15 MED ORDER — BISACODYL 10 MG RE SUPP
10.0000 mg | Freq: Every day | RECTAL | Status: DC | PRN
Start: 2014-08-15 — End: 2014-08-17

## 2014-08-15 MED ORDER — LACTATED RINGERS IV BOLUS (SEPSIS): 1000.0000 mL | INTRAVENOUS | Status: DC

## 2014-08-15 MED ORDER — FENTANYL CITRATE 0.05 MG/ML IJ SOLN: 50.0000 ug | INTRAMUSCULAR | Status: DC | PRN

## 2014-08-15 MED ORDER — OXYTOCIN 40 UNITS IN LACTATED RINGERS INFUSION - SIMPLE MED: 62.5000 mL/h | INTRAVENOUS | Status: DC | PRN

## 2014-08-15 MED ORDER — PRENATAL MULTIVITAMIN CH
1.0000 | ORAL_TABLET | Freq: Every day | ORAL | Status: DC
  Administered 2014-08-16 – 2014-08-17 (×2): 1 via ORAL
  Filled 2014-08-15 (×2): qty 1

## 2014-08-15 MED ORDER — FENTANYL CITRATE 0.05 MG/ML IJ SOLN
100.0000 ug | INTRAMUSCULAR | Status: DC | PRN
  Administered 2014-08-15: 100 ug via INTRAVENOUS
  Filled 2014-08-15: qty 2

## 2014-08-15 MED ORDER — LIDOCAINE HCL (PF) 1 % IJ SOLN: 30.0000 mL | INTRAMUSCULAR | Status: DC | PRN

## 2014-08-15 MED ORDER — FENTANYL 2.5 MCG/ML BUPIVACAINE 1/10 % EPIDURAL INFUSION (WH - ANES): 14.0000 mL/h | INTRAMUSCULAR | Status: DC | PRN

## 2014-08-15 MED ORDER — FERROUS SULFATE 325 (65 FE) MG PO TABS
325.0000 mg | ORAL_TABLET | Freq: Two times a day (BID) | ORAL | Status: DC
  Administered 2014-08-16 – 2014-08-17 (×3): 325 mg via ORAL
  Filled 2014-08-15 (×3): qty 1

## 2014-08-15 MED ORDER — TETANUS-DIPHTH-ACELL PERTUSSIS 5-2.5-18.5 LF-MCG/0.5 IM SUSP: 0.5000 mL | Freq: Once | INTRAMUSCULAR | Status: DC

## 2014-08-15 MED ORDER — ONDANSETRON HCL 4 MG/2ML IJ SOLN: 4.0000 mg | INTRAMUSCULAR | Status: DC | PRN

## 2014-08-15 MED ORDER — METHYLERGONOVINE MALEATE 0.2 MG PO TABS: 0.2000 mg | ORAL_TABLET | ORAL | Status: DC | PRN

## 2014-08-15 MED ORDER — DIBUCAINE 1 % RE OINT
1.0000 "application " | TOPICAL_OINTMENT | RECTAL | Status: DC | PRN
  Administered 2014-08-16: 1 via RECTAL
  Filled 2014-08-15: qty 28

## 2014-08-15 MED ORDER — PHENYLEPHRINE 40 MCG/ML (10ML) SYRINGE FOR IV PUSH (FOR BLOOD PRESSURE SUPPORT): 80.0000 ug | PREFILLED_SYRINGE | INTRAVENOUS | Status: DC | PRN

## 2014-08-15 MED ORDER — LACTATED RINGERS IV SOLN
500.0000 mL | Freq: Once | INTRAVENOUS | Status: AC
  Administered 2014-08-15: 500 mL via INTRAVENOUS

## 2014-08-15 NOTE — Progress Notes (Signed)
Pt was having a contraction when she was called in from waiting room.

## 2014-08-15 NOTE — H&P (Signed)
Ernestina Pennashley R Tingley is a 23 y.o. female G3P2002 with IUP at 119w4d presenting for contractions. Pt states she has been having regular, every 2-32 minutes contractions, associated with none vaginal bleeding.  Membranes are intact, with active fetal movement.   [facility]  Prenatal History/Complications:  1 child with Robertsonian translocation down syndrome, 1 child without it (so FOB must be carrier, pt cannot be a carrier:  This baby has a different FOB)  Past Medical History: Past Medical History  Diagnosis Date  . Chlamydia 2011    Past Surgical History: Past Surgical History  Procedure Laterality Date  . Dilation and curettage of uterus      Obstetrical History: OB History    Gravida Para Term Preterm AB TAB SAB Ectopic Multiple Living   3 2 2       2        Social History: History   Social History  . Marital Status: Single    Spouse Name: N/A    Number of Children: N/A  . Years of Education: N/A   Social History Main Topics  . Smoking status: Never Smoker   . Smokeless tobacco: None  . Alcohol Use: No  . Drug Use: No  . Sexual Activity: None   Other Topics Concern  . None   Social History Narrative    Family History: Family History  Problem Relation Age of Onset  . Hypertension Mother   . Diabetes Maternal Grandmother   . Down syndrome Daughter     Allergies: No Known Allergies  Prescriptions prior to admission  Medication Sig Dispense Refill Last Dose  . Prenatal Vit-Fe Fumarate-FA (PRENATAL VITAMIN PO) Take by mouth.   08/14/2014 at Unknown time  . senna-docusate (SENOKOT-S) 8.6-50 MG per tablet Take 2 tablets by mouth 2 (two) times daily as needed for mild constipation. (Patient not taking: Reported on 08/11/2014) 60 tablet 2      Prenatal Transfer Tool  Maternal Diabetes: No Genetic Screening: Declined Maternal Ultrasounds/Referrals: Normal Fetal Ultrasounds or other Referrals:  None Maternal Substance Abuse:   No Significant Maternal Medications:  None Significant Maternal Lab Results: None     Review of Systems   Constitutional: Negative for fever, chills, weight loss, malaise/fatigue and diaphoresis.  HENT: Negative for hearing loss, ear pain, nosebleeds, congestion, sore throat, neck pain, tinnitus and ear discharge.   Eyes: Negative for blurred vision, double vision, photophobia, pain, discharge and redness.  Respiratory: Negative for cough, hemoptysis, sputum production, shortness of breath, wheezing and stridor.   Cardiovascular: Negative for chest pain, palpitations, orthopnea,  leg swelling  Gastrointestinal: Positive for abdominal pain. Negative for heartburn, nausea, vomiting, diarrhea, constipation, blood in stool Genitourinary: Negative for dysuria, urgency, frequency, hematuria and flank pain.  Musculoskeletal: Negative for myalgias, back pain, joint pain and falls.  Skin: Negative for itching and rash.  Neurological: Negative for dizziness, tingling, tremors, sensory change, speech change, focal weakness, seizures, loss of consciousness, weakness and headaches.  Endo/Heme/Allergies: Negative for environmental allergies and polydipsia. Does not bruise/bleed easily.  Psychiatric/Behavioral: Negative for depression, suicidal ideas, hallucinations, memory loss and substance abuse. The patient is not nervous/anxious and does not have insomnia.       Blood pressure 136/73, pulse 73, temperature 98.4 F (36.9 C), temperature source Oral, resp. rate 18, height 5\' 2"  (1.575 m), weight 84.823 kg (187 lb), last menstrual period 11/11/2013. General appearance: alert, cooperative and mild distress Lungs: clear to auscultation bilaterally Heart: regular rate and rhythm Abdomen: soft,  non-tender; bowel sounds normal Extremities: Homans sign is negative, no sign of DVT DTR's 2+ Presentation: cephalic Fetal monitoringBaseline: 140 bpm, Variability: Good {> 6 bpm), Accelerations: Reactive and  Decelerations: Absent Uterine activity 3-5 minute contractions  CX:  6/50/-2  AROM with clear fluid  Prenatal labs: ABO, Rh: A/Positive/-- (09/03 0000)A+ Antibody: Negative (09/03 0000)neg Rubella:  immune RPR: Nonreactive (09/03 0000) neg HBsAg: Negative (09/03 0000) neg HIV: Non-reactive (09/03 0000) neg GBS: Negative (12/10 0000) neg 1 hr Glucola 80 Genetic screening  Too late Anatomy US normal   Results for orders placed or performed during the hospital encounter of 08/15/14 (from the past 24 hour(s))  CBC   Collection Time: 08/15/14  7:39 PM  Result Value Ref Range   WBC 10.5 4.0 - 10.5 K/uL   RBC 3.93 3.87 - 5.11 MIL/uL   Hemoglobin 10.6 (L) 12.0 - 15.0 g/dL   HCT 16.133.8 (L) 09.636.0 - 04.546.0 %   MCV 86.0 78.0 - 100.0 fL   MCH 27.0 26.0 - 34.0 pg   MCHC 31.4 30.0 - 36.0 g/dL   RDW 40.913.4 81.111.5 - 91.415.5 %   Platelets 224 150 - 400 K/uL    Assessment: Ernestina Pennashley R Kight is a 23 y.o. [id number] with an IUP at 7280w4d presenting for active labor  Plan: #Labor: active #Pain: Epidural #FWB Cat 1 #ID: GBS: neg   CRESENZO-DISHMAN,Tiney Zipper 08/15/2014, 8:01 PM

## 2014-08-15 NOTE — Anesthesia Procedure Notes (Signed)

## 2014-08-15 NOTE — Anesthesia Preprocedure Evaluation (Signed)

## 2014-08-15 NOTE — MAU Note (Signed)
Pt states she has been having contractions since 08/11/2014.

## 2014-08-15 NOTE — MAU Provider Note (Signed)
Labor check. Small amount of cervical change, but too uncomfortable to go home yet. Requesting augmentation if she does not change on her own. Favorable if we decide to proceed, but encouraged SOL. Will give IV bolus, Fentanyl and recheck cervix.  Hammond[place]ia Saroya Riccobono, [place] 08/15/2014 5:19 PM

## 2014-08-16 LAB — HIV ANTIBODY (ROUTINE TESTING W REFLEX): HIV: NONREACTIVE

## 2014-08-16 LAB — RPR

## 2014-08-16 NOTE — Anesthesia Postprocedure Evaluation (Signed)
  Anesthesia Post-op Note  Patient: Alicia Craig  Procedure(s) Performed: * No procedures listed *  Patient Location: Mother/Baby  Anesthesia Type:Epidural  Level of Consciousness: awake, alert , oriented and patient cooperative  Airway and Oxygen Therapy: Patient Spontanous Breathing  Post-op Pain: mild  Post-op Assessment: Patient's Cardiovascular Status Stable, Respiratory Function Stable, No headache, No residual numbness and No residual motor weakness;complains of moderate back pain but denies worsening pain and no problems ambulating or with bowel or bladder.  Told to contact anesthesia via RN if pain worsens.  Post-op Vital Signs: stable  Last Vitals:  Filed Vitals:   08/16/14 0330  BP: 112/67  Pulse: 74  Temp: 36.7 C  Resp: 18    Complications: No apparent anesthesia complications

## 2014-08-16 NOTE — Progress Notes (Signed)
Post Partum Day 1 Subjective: no complaints, up ad lib, voiding and tolerating PO, small lochia, plans to bottle feed, IUD  Objective: Blood pressure 112/67, pulse 74, temperature 98 F (36.7 C), temperature source Oral, resp. rate 18, height 5\' 2"  (1.575 m), weight 84.823 kg (187 lb), last menstrual period 11/11/2013, SpO2 100 %, unknown if currently breastfeeding.  Physical Exam:  General: alert, cooperative and no distress Lochia:normal flow Chest: CTAB Heart: RRR no m/r/g Abdomen: +BS, soft, nontender,  Uterine Fundus: firm DVT Evaluation: No evidence of DVT seen on physical exam. Extremities: trace edema   Recent Labs  08/15/14 1939  HGB 10.6*  HCT 33.8*    Assessment/Plan: Plan for discharge tomorrow   LOS: 1 day   Alicia Craig,Alicia Craig 08/16/2014, 7:35 AM

## 2014-08-17 MED ORDER — IBUPROFEN 600 MG PO TABS: 600.0000 mg | ORAL_TABLET | Freq: Four times a day (QID) | ORAL | Status: DC | PRN

## 2014-08-17 MED ORDER — OXYCODONE-ACETAMINOPHEN 5-325 MG PO TABS: 1.0000 | ORAL_TABLET | ORAL | Status: DC | PRN

## 2014-08-17 NOTE — Discharge Instructions (Signed)

## 2014-08-17 NOTE — Progress Notes (Signed)
UR chart review completed.  

## 2014-08-17 NOTE — Discharge Summary (Signed)
Obstetric Discharge Summary Reason for Admission: onset of labor Prenatal Procedures: none Intrapartum Procedures: spontaneous vaginal delivery Postpartum Procedures: none Complications-Operative and Postpartum: none HEMOGLOBIN  Date Value Ref Range Status  08/15/2014 10.6* 12.0 - 15.0 g/dL Final   HCT  Date Value Ref Range Status  08/15/2014 33.8* 36.0 - 46.0 % Final   [name] is a 23yo [id number] admitted @ 39.4 on the evening of 12/29 in active labor. Shortly after arrival she progressed to SVD that same evening. Her PP course has been uneventful and by PPD#2 she is doing well and is deemed to have received the full benefit of her hospital stay and will be discharged home. She is bottlefeeding and plans on an IUD for contraception. She is requesting something stronger than Motrin for her uterine cramping; I will give her #10 Percocet.  Physical Exam:  General: alert, cooperative and no distress  Heart: RRR Lungs: nl effort Lochia: appropriate Uterine Fundus: firm DVT Evaluation: No evidence of DVT seen on physical exam.  Discharge Diagnoses: Term Pregnancy-delivered  Discharge Information: Date: 08/17/2014 Activity: pelvic rest Diet: routine Medications: PNV, Ibuprofen and Percocet Condition: stable Instructions: refer to practice specific booklet Discharge to: home Follow-up Information    [facility] [place]. Schedule an appointment as soon as possible for a visit in 4 weeks.   Why:  For your postpartum appointment.   Contact information:   [address] [place] [id number] [phone]      Newborn Data: Live born female  Birth Weight: 6 lb 4.5 oz (2849 g) APGAR: 9, 9  Home with mother.  Alicia Craig, KIMBERLY CNM 08/17/2014, 9:19 AM

## 2014-08-17 NOTE — Progress Notes (Signed)
Post Partum Day #1 Subjective: no complaints, up ad lib and tolerating PO; bottlefeeding; desires Mirena for contraception  Objective: Blood pressure 112/49, pulse 62, temperature 98 F (36.7 C), temperature source Oral, resp. rate 18, height 5\' 2"  (1.575 m), weight 84.823 kg (187 lb), last menstrual period 11/11/2013, SpO2 100 %, unknown if currently breastfeeding.  Physical Exam:  General: alert, cooperative and no distress  Heart: RRR Lungs: nl effort Lochia: appropriate Uterine Fundus: firm DVT Evaluation: No evidence of DVT seen on physical exam.   Recent Labs  08/15/14 1939  HGB 10.6*  HCT 33.8*    Assessment/Plan: Plan for discharge tomorrow   LOS: 2 days   Alicia Craig, Alicia Craig CNM 08/17/2014, 9:17 AM

## 2014-12-20 ENCOUNTER — Emergency Department (HOSPITAL_COMMUNITY)
Admission: EM | Admit: 2014-12-20 | Discharge: 2014-12-20 | Disposition: A | Discharge status: Home / Self Care | Payer: Medicaid Other | Attending: Emergency Medicine | Admitting: Emergency Medicine

## 2014-12-20 ENCOUNTER — Encounter (HOSPITAL_COMMUNITY): Payer: Self-pay | Admitting: *Deleted

## 2014-12-20 DIAGNOSIS — H9203 Otalgia, bilateral: Secondary | ICD-10-CM | POA: Diagnosis not present

## 2014-12-20 DIAGNOSIS — J069 Acute upper respiratory infection, unspecified: Secondary | ICD-10-CM

## 2014-12-20 DIAGNOSIS — H748X3 Other specified disorders of middle ear and mastoid, bilateral: Secondary | ICD-10-CM | POA: Insufficient documentation

## 2014-12-20 DIAGNOSIS — Z8619 Personal history of other infectious and parasitic diseases: Secondary | ICD-10-CM | POA: Insufficient documentation

## 2014-12-20 MED ORDER — SALINE SPRAY 0.65 % NA SOLN: 2.0000 | NASAL | Status: DC | PRN

## 2014-12-20 MED ORDER — DM-GUAIFENESIN ER 30-600 MG PO TB12: 1.0000 | ORAL_TABLET | Freq: Two times a day (BID) | ORAL | Status: DC

## 2014-12-20 NOTE — ED Notes (Signed)
Pt c/o ear aches with a cold. Pt reports hx of the same.

## 2014-12-20 NOTE — Discharge Instructions (Signed)
Cool Mist Vaporizers °Vaporizers may help relieve the symptoms of a cough and cold. They add moisture to the air, which helps mucus to become thinner and less sticky. This makes it easier to breathe and cough up secretions. Cool mist vaporizers do not cause serious burns like hot mist vaporizers, which may also be called steamers or humidifiers. Vaporizers have not been proven to help with colds. You should not use a vaporizer if you are allergic to mold. °HOME CARE INSTRUCTIONS °· Follow the package instructions for the vaporizer. °· Do not use anything other than distilled water in the vaporizer. °· Do not run the vaporizer all of the time. This can cause mold or bacteria to grow in the vaporizer. °· Clean the vaporizer after each time it is used. °· Clean and dry the vaporizer well before storing it. °· Stop using the vaporizer if worsening respiratory symptoms develop. °Document Released: 05/01/2004 Document Revised: 08/09/2013 Document Reviewed: 12/22/2012 °ExitCare® Patient Information ©2015 [employer]. This information is not intended to replace advice given to you by your health care provider. Make sure you discuss any questions you have with your health care provider. ° °

## 2014-12-20 NOTE — ED Provider Notes (Signed)
CSN: [id number]     Arrival date & time 12/20/14  1819 History  This chart was scribed for non-physician practitioner working with [name], MD by Richarda Overlieichard Holland, ED Scribe. This patient was seen in room TR11C/TR11C and the patient's care was started at 6:52 PM.    Chief Complaint  Patient presents with  . URI  . Otalgia   The history is provided by the patient. No language interpreter was used.   HPI Comments: Alicia Craig is a 24 y.o. female who presents to the Emergency Department complaining of bilateral ear aches that started yesterday. She rates her ear pain as a 5/10 at this time and says her ears have been popping. Pt reports associated sore throat and a dry cough. She reports that she gets ear infections frequently. Pt states she has not taken any medications for her symptoms. Pt denies any hx of asthma. She states her children are sick currently with similar symptoms. Pt reports NKDA. She reports no alleviating or exacerbating factors at this time. She denies fever, nausea or vomiting.   Past Medical History  Diagnosis Date  . Chlamydia 2011   Past Surgical History  Procedure Laterality Date  . Dilation and curettage of uterus     Family History  Problem Relation Age of Onset  . Hypertension Mother   . Diabetes Maternal Grandmother   . Down syndrome Daughter    History  Substance Use Topics  . Smoking status: Never Smoker   . Smokeless tobacco: Not on file  . Alcohol Use: No   OB History    Gravida Para Term Preterm AB TAB SAB Ectopic Multiple Living   3 3 3       0 1     Review of Systems  Constitutional: Negative for fever.  HENT: Positive for ear pain and sore throat.   Respiratory: Positive for cough.   Gastrointestinal: Negative for nausea and vomiting.  All other systems reviewed and are negative.     Allergies  Review of patient's allergies indicates no known allergies.  Home Medications   Prior to Admission medications   Medication Sig  Start Date End Date Taking? Authorizing Provider  dextromethorphan-guaiFENesin (MUCINEX DM) 30-600 MG per 12 hr tablet Take 1 tablet by mouth 2 (two) times daily. Be sure to drink a large glass of water with each dose 12/20/14   Junius FinnerErin O'Malley, PA-C  ibuprofen (ADVIL,MOTRIN) 600 MG tablet Take 1 tablet (600 mg total) by mouth every 6 (six) hours as needed. Patient not taking: Reported on 12/20/2014 08/17/14   Arabella MerlesKimberly D Shaw, CNM  oxyCODONE-acetaminophen (PERCOCET/ROXICET) 5-325 MG per tablet Take 1 tablet by mouth every 4 (four) hours as needed (for pain scale less than 7). Patient not taking: Reported on 12/20/2014 08/17/14   Arabella MerlesKimberly D Shaw, CNM  sodium chloride (OCEAN) 0.65 % SOLN nasal spray Place 2 sprays into both nostrils as needed for congestion. 12/20/14   Junius FinnerErin O'Malley, PA-C   BP 128/75 mmHg  Pulse 85  Temp(Src) 98.6 F (37 C) (Oral)  Resp 18  Ht 5\' 2"  (1.575 m)  Wt 150 lb (68.04 kg)  BMI 27.43 kg/m2  SpO2 99% Physical Exam  Constitutional: She is oriented to person, place, and time. She appears well-developed and well-nourished.  HENT:  Head: Normocephalic and atraumatic.  Right Ear: No drainage or tenderness. Tympanic membrane is not injected, not erythematous and not bulging. A middle ear effusion ( clear) is present.  Left Ear: No drainage or tenderness. Tympanic  membrane is not injected, not erythematous and not bulging. A middle ear effusion ( clear) is present.  Nose: Mucosal edema present.  Mouth/Throat: Uvula is midline and mucous membranes are normal. Posterior oropharyngeal erythema present. No oropharyngeal exudate, posterior oropharyngeal edema or tonsillar abscesses.  Eyes: Right eye exhibits no discharge. Left eye exhibits no discharge.  Neck: Neck supple. No tracheal deviation present.  Cardiovascular: Normal rate, regular rhythm and normal heart sounds.   No murmur heard. Pulmonary/Chest: Effort normal and breath sounds normal. No respiratory distress. She has no  wheezes. She has no rales.  Abdominal: She exhibits no distension.  Neurological: She is alert and oriented to person, place, and time.  Skin: Skin is warm and dry.  Psychiatric: She has a normal mood and affect. Her behavior is normal.  Nursing note and vitals reviewed.   ED Course  Procedures  DIAGNOSTIC STUDIES: Oxygen Saturation is 98% on RA, normal by my interpretation.    COORDINATION OF CARE: 6:54 PM Discussed treatment plan with pt at bedside and pt agreed to plan.    Labs Review Labs Reviewed - No data to display  Imaging Review No results found.   EKG Interpretation None      MDM   Final diagnoses:  URI, acute  Otalgia, bilateral   Pt presenting to ED with c/o URI symptoms since yesterday. Lungs: CTAB, no evidence of AOM,  Will tx symptomatically. Advised pt to use acetaminophen and ibuprofen as needed for fever and pain. Encouraged rest and fluids. Return precautions provided. Pt verbalized understanding and agreement with tx plan.   I personally performed the services described in this documentation, which was scribed in my presence. The recorded information has been reviewed and is accurate.     Junius Finnerrin O'Malley, PA-C 12/20/14 1928  [name], MD 12/21/14 1318

## 2014-12-26 ENCOUNTER — Emergency Department (HOSPITAL_COMMUNITY)
Admission: EM | Admit: 2014-12-26 | Discharge: 2014-12-26 | Disposition: A | Discharge status: Home / Self Care | Payer: Medicaid Other | Attending: Emergency Medicine | Admitting: Emergency Medicine

## 2014-12-26 ENCOUNTER — Encounter (HOSPITAL_COMMUNITY): Payer: Self-pay | Admitting: *Deleted

## 2014-12-26 DIAGNOSIS — Z79899 Other long term (current) drug therapy: Secondary | ICD-10-CM | POA: Insufficient documentation

## 2014-12-26 DIAGNOSIS — R0981 Nasal congestion: Secondary | ICD-10-CM | POA: Diagnosis not present

## 2014-12-26 DIAGNOSIS — R05 Cough: Secondary | ICD-10-CM | POA: Diagnosis not present

## 2014-12-26 DIAGNOSIS — Z8619 Personal history of other infectious and parasitic diseases: Secondary | ICD-10-CM | POA: Insufficient documentation

## 2014-12-26 DIAGNOSIS — H9202 Otalgia, left ear: Secondary | ICD-10-CM | POA: Diagnosis present

## 2014-12-26 DIAGNOSIS — H66012 Acute suppurative otitis media with spontaneous rupture of ear drum, left ear: Secondary | ICD-10-CM | POA: Insufficient documentation

## 2014-12-26 MED ORDER — AMOXICILLIN 500 MG PO CAPS: 500.0000 mg | ORAL_CAPSULE | Freq: Three times a day (TID) | ORAL | Status: DC

## 2014-12-26 MED ORDER — FLUTICASONE PROPIONATE 50 MCG/ACT NA SUSP: 2.0000 | Freq: Every day | NASAL | Status: DC

## 2014-12-26 NOTE — ED Notes (Signed)
Pt in c/o left earache for the last few days, seen for same earlier this week but symptoms are not improved, no distress noted

## 2014-12-26 NOTE — ED Provider Notes (Signed)
CSN: [id number]     Arrival date & time 12/26/14  2043 History  This chart was scribed for non-physician practitioner, Dierdre ForthHannah Timica Marcom, PA-C working with Doug SouSam Jacubowitz, MD by Buckner MaltaJason Robinson, ED scribe. This patient was seen in room TR02C/TR02C and the patient's care was started at 9:45 PM      Chief Complaint  Patient presents with  . Otalgia      The history is provided by the patient. No language interpreter was used.    HPI Comments: Alicia Craig is a 24 y.o. female with no chronic medical Hx who presents to the Emergency Department complaining of a mild, constant left ear pain that started 1 week ago. She reports yellow ear drainage, HA, sinus congestion, decreased hearing and cough as associated symptoms. The patient has not taken any medications PTA. Pt was seen by the ER two days ago and was told that she had fluid behind her ear.  She was prescribed Ocean nasal spray but she did not fill the Rx.  No treatments PTA.  She reports that while her URI symptoms are improving, her left ear pain has steadily worsened.  She denies fevers, chills, and bloody discharge from the ear as associated symptoms.     Past Medical History  Diagnosis Date  . Chlamydia 2011   Past Surgical History  Procedure Laterality Date  . Dilation and curettage of uterus     Family History  Problem Relation Age of Onset  . Hypertension Mother   . Diabetes Maternal Grandmother   . Down syndrome Daughter    History  Substance Use Topics  . Smoking status: Never Smoker   . Smokeless tobacco: Not on file  . Alcohol Use: No   OB History    Gravida Para Term Preterm AB TAB SAB Ectopic Multiple Living   3 3 3       0 1     Review of Systems  Constitutional: Positive for fatigue. Negative for fever, chills and appetite change.  HENT: Positive for congestion, ear discharge, ear pain, postnasal drip, rhinorrhea and sinus pressure. Negative for mouth sores and sore throat.   Eyes: Negative for  visual disturbance.  Respiratory: Positive for cough. Negative for chest tightness, shortness of breath, wheezing and stridor.   Cardiovascular: Negative for chest pain, palpitations and leg swelling.  Gastrointestinal: Negative for nausea, vomiting, abdominal pain and diarrhea.  Genitourinary: Negative for dysuria, urgency, frequency and hematuria.  Musculoskeletal: Negative for myalgias, back pain, arthralgias and neck stiffness.  Skin: Negative for rash.  Neurological: Positive for headaches. Negative for syncope, light-headedness and numbness.  Hematological: Negative for adenopathy.  Psychiatric/Behavioral: The patient is not nervous/anxious.   All other systems reviewed and are negative.     Allergies  Review of patient's allergies indicates no known allergies.  Home Medications   Prior to Admission medications   Medication Sig Start Date End Date Taking? Authorizing Provider  amoxicillin (AMOXIL) 500 MG capsule Take 1 capsule (500 mg total) by mouth 3 (three) times daily. 12/26/14   Lovelle Deitrick, PA-C  dextromethorphan-guaiFENesin (MUCINEX DM) 30-600 MG per 12 hr tablet Take 1 tablet by mouth 2 (two) times daily. Be sure to drink a large glass of water with each dose 12/20/14   Junius FinnerErin O'Malley, PA-C  fluticasone [facility](FLONASE) 50 MCG/ACT nasal spray Place 2 sprays into both nostrils daily. 12/26/14   Davin Archuletta, PA-C  ibuprofen (ADVIL,MOTRIN) 600 MG tablet Take 1 tablet (600 mg total) by mouth every 6 (six) hours as  needed. Patient not taking: Reported on 12/20/2014 08/17/14   Arabella Merles, CNM  oxyCODONE-acetaminophen (PERCOCET/ROXICET) 5-325 MG per tablet Take 1 tablet by mouth every 4 (four) hours as needed (for pain scale less than 7). Patient not taking: Reported on 12/20/2014 08/17/14   Arabella Merles, CNM  sodium chloride (OCEAN) 0.65 % SOLN nasal spray Place 2 sprays into both nostrils as needed for congestion. 12/20/14   Junius Finner, PA-C   BP 115/67 mmHg  Pulse  77  Temp(Src) 98.6 F (37 C) (Oral)  Resp 16  Wt 170 lb (77.111 kg)  SpO2 100% Physical Exam  Constitutional: She is oriented to person, place, and time. She appears well-developed and well-nourished. No distress.  HENT:  Head: Normocephalic and atraumatic.  Right Ear: External ear and ear canal normal. Tympanic membrane is not injected, not perforated, not erythematous and not bulging. A middle ear effusion ( clear) is present.  Left Ear: External ear and ear canal normal. Tympanic membrane is injected, perforated and erythematous. A middle ear effusion ( purulent) is present.  Nose: Mucosal edema and rhinorrhea present. No epistaxis. Right sinus exhibits no maxillary sinus tenderness and no frontal sinus tenderness. Left sinus exhibits no maxillary sinus tenderness and no frontal sinus tenderness.  Mouth/Throat: Uvula is midline, oropharynx is clear and moist and mucous membranes are normal. Mucous membranes are not pale and not cyanotic. No uvula swelling. No oropharyngeal exudate, posterior oropharyngeal edema, posterior oropharyngeal erythema or tonsillar abscesses.  Clear effusion noted behind the right TM Erythematous bulging TM on left with evidence of rupture purulent discharge noted in the ear canal No swelling of the ear canal   Eyes: Conjunctivae are normal. Pupils are equal, round, and reactive to light.  Neck: Normal range of motion and full passive range of motion without pain.  Cardiovascular: Normal rate and intact distal pulses.   Pulmonary/Chest: Effort normal and breath sounds normal. No stridor.  Clear and equal breath sounds without focal wheezes, rhonchi, rales  Abdominal: Soft. Bowel sounds are normal. There is no tenderness.  Musculoskeletal: Normal range of motion.  Lymphadenopathy:    She has no cervical adenopathy.  Neurological: She is alert and oriented to person, place, and time.  Skin: Skin is warm and dry. No rash noted. She is not diaphoretic.   Psychiatric: She has a normal mood and affect.  Nursing note and vitals reviewed.   ED Course  Procedures (including critical care time) DIAGNOSTIC STUDIES: Oxygen Saturation is 100% on RA, Normal by my interpretation.    COORDINATION OF CARE: 9:50 PM Discussed treatment plan at bedside which includes Flonase and Amoxil prescription. Pt agreed to plan.  Labs Review Labs Reviewed - No data to display  Imaging Review No results found.   EKG Interpretation None      MDM   Final diagnoses:  Acute suppurative otitis media of left ear with spontaneous rupture of tympanic membrane, recurrence not specified  Nasal sinus congestion   Alvan Dame Waitman presents with resolving URI symptoms but worsening left ear pain.  Patient presents with otalgia and exam consistent with acute otitis media. TM perforation of the left TM.  No concern for acute mastoiditis, meningitis.  No antibiotic use in the last month.  Patient discharged home with Amoxicillin and flonase.  Advised pt to follow-up with PCP this week.  I have also discussed reasons to return immediately to the ER.  Patient expresses understanding and agrees with plan.  BP 115/67 mmHg  Pulse 77  Temp(Src) 98.6 F (37 C) (Oral)  Resp 16  Wt 170 lb (77.111 kg)  SpO2 100%  I personally performed the services described in this documentation, which was scribed in my presence. The recorded information has been reviewed and is accurate.      Dahlia ClientHannah Etienne Mowers, PA-C 12/26/14 2246  Doug SouSam Jacubowitz, MD 12/27/14 [id number]

## 2014-12-26 NOTE — Discharge Instructions (Signed)
1. Medications: flonase, amoxicillin, Ibuprofen 800mg  3x per day with food for ear pain, usual home medications 2. Treatment: rest, drink plenty of fluids, take tylenol or ibuprofen for fever control 3. Follow Up: Please followup with your primary doctor in 3 days for discussion of your diagnoses and further evaluation after today's visit; if you do not have a primary care doctor use the resource guide provided to find one; Return to the ER for high fevers, difficulty breathing or other concerning symptoms    Otitis Media Otitis media is redness, soreness, and inflammation of the middle ear. Otitis media may be caused by allergies or, most commonly, by infection. Often it occurs as a complication of the common cold. SIGNS AND SYMPTOMS Symptoms of otitis media may include:  Earache.  Fever.  Ringing in your ear.  Headache.  Leakage of fluid from the ear. DIAGNOSIS To diagnose otitis media, your health care provider will examine your ear with an otoscope. This is an instrument that allows your health care provider to see into your ear in order to examine your eardrum. Your health care provider also will ask you questions about your symptoms. TREATMENT  Typically, otitis media resolves on its own within 3-5 days. Your health care provider may prescribe medicine to ease your symptoms of pain. If otitis media does not resolve within 5 days or is recurrent, your health care provider may prescribe antibiotic medicines if he or she suspects that a bacterial infection is the cause. HOME CARE INSTRUCTIONS   If you were prescribed an antibiotic medicine, finish it all even if you start to feel better.  Take medicines only as directed by your health care provider.  Keep all follow-up visits as directed by your health care provider. SEEK MEDICAL CARE IF:  You have otitis media only in one ear, or bleeding from your nose, or both.  You notice a lump on your neck.  You are not getting better in  3-5 days.  You feel worse instead of better. SEEK IMMEDIATE MEDICAL CARE IF:   You have pain that is not controlled with medicine.  You have swelling, redness, or pain around your ear or stiffness in your neck.  You notice that part of your face is paralyzed.  You notice that the bone behind your ear (mastoid) is tender when you touch it. MAKE SURE YOU:   Understand these instructions.  Will watch your condition.  Will get help right away if you are not doing well or get worse. Document Released: 05/09/2004 Document Revised: 12/19/2013 Document Reviewed: 03/01/2013 [facility] Patient Information 2015 [place], [place]. This information is not intended to replace advice given to you by your health care provider. Make sure you discuss any questions you have with your health care provider.    Emergency Department Resource Guide 1) Find a Doctor and Pay Out of Pocket Although you won't have to find out who is covered by your insurance plan, it is a good idea to ask around and get recommendations. You will then need to call the office and see if the doctor you have chosen will accept you as a new patient and what types of options they offer for patients who are self-pay. Some doctors offer discounts or will set up payment plans for their patients who do not have insurance, but you will need to ask so you aren't surprised when you get to your appointment.  2) Contact Your Local Health Department Not all health departments have doctors that can see  patients for sick visits, but many do, so it is worth a call to see if yours does. If you don't know where your local health department is, you can check in your phone book. The CDC also has a tool to help you locate your state's health department, and many state websites also have listings of all of their local health departments.  3) Find a Walk-in Clinic If your illness is not likely to be very severe or complicated, you may want to try a walk in  clinic. These are popping up all over the country in pharmacies, drugstores, and shopping centers. They're usually staffed by nurse practitioners or physician assistants that have been trained to treat common illnesses and complaints. They're usually fairly quick and inexpensive. However, if you have serious medical issues or chronic medical problems, these are probably not your best option.  No Primary Care Doctor: - Call Health Connect at  [phone] - they can help you locate a primary care doctor that  accepts your insurance, provides certain services, etc. - Physician Referral Service- [phone]  Chronic Pain Problems: Organization         Address  Phone   Notes  [facility]  [phone] Patients need to be referred by their primary care doctor.   Medication Assistance: Organization         Address  Phone   Notes  [facility] Medication [facility] [address] [place], [place] [address] [phone] --Must be a resident of [facility] [place] -- Must have NO insurance coverage whatsoever (no Medicaid/ Medicare, etc.) -- The pt. MUST have a primary care doctor that directs their care regularly and follows them in the community   MedAssist  [phone]   [employer]  [phone]) [phone]    Agencies that provide inexpensive medical care: Organization         Address  Phone   Notes  [facility]  [phone]   [facility]    [phone]) [phone]   [facility] [address] [place], [place] [address] [phone]   [facility] [address], [place] [phone]) [phone]   Planned Parenthood    [phone]   [facility]    [phone]   Community Health and [facility]  [address], [place] Phone:  [phone], Fax:  [phone]) [phone] Hours of Operation:  9 am - 6 pm, M-F.  Also accepts Medicaid/Medicare and self-pay.  [facility]  [address], [place] Phone: [phone](336) [phone], Fax: [phone]. Hours of Operation:  8:30 am - 5:30 pm, M-F.  Also accepts Medicaid and self-pay.  [facility] [place] [address], [place] Phone: [phone]) [phone]   [facility] [address], [place], [place] [phone], Ext. [phone] Mondays & Thursdays: 7-9 AM.  First 15 patients are seen on a first come, first serve basis.    Medicaid-accepting [facility] Providers:  Organization         Address  Phone   Notes  [facility] [address], [place] [phone] Also accepts self-pay patients.  [facility] [address], [place]  [phone]) [phone]   [facility] [address], [place] [phone]   [facility] [address] [place] [address], [place] (  [phone]   Renaye Rakers [address], [place]   [phone] Only accepts [place] Access [place] patients after they have their name applied to their card.   Self-Pay (no insurance) in [facility]:  Organization         Address  Phone   Notes  Sickle Cell Patients, [facility] [address] [place], [place] [phone]   [facility] [address] [place], [place] [phone]   Redge Gainer [address] [facility] [address]  [address], [place] [phone]   [facility]/Dr. Osei-Bonsu  [address] [place] or [address], [place] [phone] Phone number for both [place] and [place] locations is the same.  Urgent Medical and [facility] [address], [place] [phone]   [facility] [address] [place] or [address] [phone]   [facility] [address] [place] [phone], phone; [phone], fax Sees patients  1st and 3rd Saturday of every month.  Must not qualify for public or private insurance (i.e. Medicaid, Medicare, [place] Health Choice, Veterans' Benefits)  Household income should be no more than 200% of the poverty level The clinic cannot treat you if you are pregnant or think you are pregnant  Sexually transmitted diseases are not treated at the clinic.    Dental Care: Organization         Address  Phone  Notes  [facility] [address] [place], [place] [phone] Accepts children up to age 48 who are enrolled in [place] or [place] Health Choice; pregnant women with a Medicaid card; and children who have applied for Medicaid or [place] Health Choice, but were declined, whose parents can pay a reduced fee at time of service.  [facility]  [address], [place] [phone] Accepts children up to age 68 who are enrolled in [place] or [place] Health Choice; pregnant women with a Medicaid card; and children who have applied for Medicaid or [place] Health Choice, but were declined, whose parents can pay a reduced fee at time of service.  [facility]  [address] [place], [place] [phone] Patients are seen by appointment only. Walk-ins are not accepted. [facility] will see patients 28 years of age and older. Monday - Tuesday (8am-5pm) Most Wednesdays (8:30-5pm) $30 per visit, cash only  [facility]  [address], [facility] [phone] Patients are seen by appointment only. Walk-ins are not accepted. [facility] will see patients 41 years of age and older. One Wednesday Evening (Monthly: Volunteer Based).  $30 per visit, cash only  [employer]  [phone] for adults; Children under age 26, call Graduate Pediatric Dentistry at [phone]. Children aged 46-14, please call [phone] to request a  pediatric application.  Dental services are provided in all areas of dental care including fillings, crowns and bridges, complete and partial dentures, implants, gum treatment, root canals, and extractions. Preventive care is also provided. Treatment is provided to both adults and children. Patients are selected via a lottery and there is often a waiting list.   [facility] [address] [place]  [phone] [email or web address] farm manager Dental [address] [place], [place] [phone], Ext. [phone] Second and Fourth Thursday of each month, opens at 6:30 AM; Clinic ends at 9 AM.  Patients are seen on a first-come first-served basis, and a limited number are seen during each clinic.   [facility]  [address], [place], [place] [phone]   Eligibility Requirements You must have lived in [place], [place], or [place] counties for at least the last three months.   You cannot be eligible for state or federal sponsored National Cityhealthcare insurance, including CIGNAVeterans Administration, [place]icaid, or [employer] EntertainmentMedicare.   You generally cannot be eligible for healthcare insurance through your employer.    How to apply: Eligibility screenings are held every Tuesday and Wednesday afternoon from 1:00 pm until 4:00 pm. You do not need an appointment for the interview!  [facility] [address], [place], [place] [phone]   [facility]  [phone]   [facility]  [phone]   [facility]  [phone]    Behavioral Health Resources in the Community: Intensive Outpatient Programs Organization         Address  Phone  Notes  [facility] [address], [place], [place] [phone]   [facility] [address], [place], [place] [phone]   [facility] [address], [place], [place]  [phone]   [facility]  Mental Health [address],  [place], [place] [phone] or [phone]   Substance Abuse Resources Organization         Address  Phone  Notes  [facility] Drug Services  [phone]   [facility]  [phone]   [facility]  [phone]   [facility]  [phone]   Residential & Outpatient Substance Abuse Program  [phone]   Psychological Services Organization         Address  Phone  Notes  [facility]  [phone]- [phone]   [facility]  [phone]- [phone]   [facility] [address], [place] [phone] or [phone]    Mobile Crisis Teams Organization         Address  Phone  Notes  [facility], Mobile Crisis Care Unit  [phone]   [facility]  [address] [place], [place] [phone]   Doristine LocksSharon DeEsch [address] [place] [phone]    Self-Help/Support Groups Organization         Address  Phone             Notes  [facility]ental Health Assoc. of [place] - variety of support groups  [phone] Call for more information  Narcotics Anonymous (NA), Caring [facility] [address], [place]  2 meetings at this location   Statisticianesidential Treatment Programs Organization         Address  Phone  Notes  [facility] [address],    [place]  [phone]   [facility]  [address], [place] [phone], [place], [place] [phone]   [facility] [address], [place] [phone] Admissions: 8am-3pm M-F  [facility] [address]    [place], [place] [phone]   [facility] [address], [place], [place] [phone]   [facility] [address]  [place], [place] [phone]   [facility] - Intensive Outpatient [address], [place], [place] [phone]   [facility] (  [facility] [address]    [place], [place] [phone] or [phone]   [facility] ([facility]) [address] [place], [place] [phone] Accepts Medicaid  [facility] [address]  [place] [phone] Substance Abuse/Addiction Treatment   [facility] Behavioral Health Resources Organization         Address  Phone  Notes  [facility]  [phone]   Angie FavaJulie Brannon, PhD [address] [place], [place]   [phone](336) [phone] or [phone]) [phone]   [facility]   [address] [place], [place] [phone]) [phone]   [facility] [address], [place], [place] [phone]) [phone] Insurance/Medicaid/sponsorship through [facility]  [facility] Families [address]                                    [place], [place] [phone]) [phone] Therapy/tele-psych/case  [facility] [address]   [place], [place] [phone]) [phone]    Dr. Lolly MustacheArfeen  [phone](336) [phone]   [facility]  United Way [facility] 1) [address], [place] 2) [address], [place] 3)  [address], [place] [phone]  [phone]) [phone]   [facility] Child Abuse Hotline [phone]) [phone] or [phone](336) [phone] (After Hours)

## 2017-06-01 ENCOUNTER — Ambulatory Visit (HOSPITAL_COMMUNITY)
Admission: EM | Admit: 2017-06-01 | Discharge: 2017-06-01 | Disposition: A | Discharge status: Home / Self Care | Payer: Medicaid Other | Attending: Emergency Medicine | Admitting: Emergency Medicine

## 2017-06-01 ENCOUNTER — Encounter (HOSPITAL_COMMUNITY): Payer: Self-pay | Admitting: Emergency Medicine

## 2017-06-01 DIAGNOSIS — Z833 Family history of diabetes mellitus: Secondary | ICD-10-CM | POA: Diagnosis not present

## 2017-06-01 DIAGNOSIS — N3090 Cystitis, unspecified without hematuria: Secondary | ICD-10-CM | POA: Diagnosis not present

## 2017-06-01 DIAGNOSIS — Z791 Long term (current) use of non-steroidal anti-inflammatories (NSAID): Secondary | ICD-10-CM | POA: Insufficient documentation

## 2017-06-01 DIAGNOSIS — Z9889 Other specified postprocedural states: Secondary | ICD-10-CM | POA: Diagnosis not present

## 2017-06-01 DIAGNOSIS — Z79899 Other long term (current) drug therapy: Secondary | ICD-10-CM | POA: Insufficient documentation

## 2017-06-01 DIAGNOSIS — Z8489 Family history of other specified conditions: Secondary | ICD-10-CM | POA: Insufficient documentation

## 2017-06-01 DIAGNOSIS — Z8249 Family history of ischemic heart disease and other diseases of the circulatory system: Secondary | ICD-10-CM | POA: Diagnosis not present

## 2017-06-01 DIAGNOSIS — Z79891 Long term (current) use of opiate analgesic: Secondary | ICD-10-CM | POA: Insufficient documentation

## 2017-06-01 LAB — POCT URINALYSIS DIP (DEVICE)
BILIRUBIN URINE: NEGATIVE
GLUCOSE, UA: NEGATIVE mg/dL
Hgb urine dipstick: NEGATIVE
Ketones, ur: NEGATIVE mg/dL
Leukocytes, UA: NEGATIVE
NITRITE: POSITIVE — AB
PH: 6 (ref 5.0–8.0)
Protein, ur: 100 mg/dL — AB
Specific Gravity, Urine: >=1.03 (ref 1.005–1.030)
Urobilinogen, UA: 1 mg/dL (ref 0.0–1.0)

## 2017-06-01 MED ORDER — SULFAMETHOXAZOLE-TRIMETHOPRIM 800-160 MG PO TABS: 1.0000 | ORAL_TABLET | Freq: Two times a day (BID) | ORAL | 0 refills | Status: AC

## 2017-06-01 NOTE — ED Provider Notes (Signed)
[facility]    CSN: [id number] Arrival date & time: 06/01/17  1551     History   Chief Complaint Chief Complaint  Patient presents with  . Urinary Tract Infection    HPI Alicia Craig is a 26 y.o. female.   26 year old female comes in for 2 day history of pressure with urinating, strong odor, dysuria. She has not tried anything for the symptoms. Still able to eat and drink without a problem. No recent antibiotic use. Denies weakness/dizziness. Denies abdominal pain, nausea, vomiting. Denies fever, chills, night sweats. Denies vaginal symptoms such as discharge, itching/pain. States she has nexplanon implant without cycles.       Past Medical History:  Diagnosis Date  . Chlamydia 2011    Patient Active Problem List   Diagnosis Date Noted  . Active labor at term 08/15/2014  . Active labor 08/15/2014  . Previous child with Down syndrome, antepartum 05/09/2014    Past Surgical History:  Procedure Laterality Date  . DILATION AND CURETTAGE OF UTERUS      OB History    Gravida Para Term Preterm AB Living   SAB TAB Ectopic Multiple Live Births         0 1       Home Medications    Prior to Admission medications   Medication Sig Start Date End Date Taking? Authorizing Provider  dextromethorphan-guaiFENesin (MUCINEX DM) 30-600 MG per 12 hr tablet Take 1 tablet by mouth 2 (two) times daily. Be sure to drink a large glass of water with each dose 12/20/14   Doroteo Glassman, Erin O, PA-C  fluticasone (FLONASE) 50 MCG/ACT nasal spray Place 2 sprays into both nostrils daily. 12/26/14   Muthersbaugh, Dahlia Client, PA-C  ibuprofen (ADVIL,MOTRIN) 600 MG tablet Take 1 tablet (600 mg total) by mouth every 6 (six) hours as needed. Patient not taking: Reported on 12/20/2014 08/17/14   Arabella Merles, CNM  oxyCODONE-acetaminophen (PERCOCET/ROXICET) 5-325 MG per tablet Take 1 tablet by mouth every 4 (four) hours as needed (for pain scale less than 7). Patient not  taking: Reported on 12/20/2014 08/17/14   Arabella Merles, CNM  sodium chloride (OCEAN) 0.65 % SOLN nasal spray Place 2 sprays into both nostrils as needed for congestion. 12/20/14   Lurene Shadow, PA-C  sulfamethoxazole-trimethoprim (BACTRIM DS,SEPTRA DS) 800-160 MG tablet Take 1 tablet by mouth 2 (two) times daily. 06/01/17 06/04/17  Belinda Fisher, PA-C    Family History Family History  Problem Relation Age of Onset  . Hypertension Mother   . Diabetes Maternal Grandmother   . Down syndrome Daughter     Social History Social History  Substance Use Topics  . Smoking status: Never Smoker  . Smokeless tobacco: Not on file  . Alcohol use No     Allergies   Patient has no known allergies.   Review of Systems Review of Systems  Reason unable to perform ROS: See HPI as above.     Physical Exam Triage Vital Signs ED Triage Vitals [06/01/17 1636]  Enc Vitals Group     BP 134/70     Pulse Rate 96     Resp 18     Temp 98.6 F (37 C)     Temp Source Oral     SpO2 100 %     Weight      Height      Head Circumference  Peak Flow      Pain Score 6     Pain Loc      Pain Edu?      Excl. in GC?    No data found.   Updated Vital Signs BP 134/70 (BP Location: Right Arm)   Pulse 96   Temp 98.6 F (37 C) (Oral)   Resp 18   SpO2 100%   Physical Exam  Constitutional: She is oriented to person, place, and time. She appears well-developed and well-nourished. No distress.  Eyes: Pupils are equal, round, and reactive to light. Conjunctivae are normal.  Cardiovascular: Normal rate, regular rhythm and normal heart sounds.  Exam reveals no gallop and no friction rub.   No murmur heard. Pulmonary/Chest: Effort normal and breath sounds normal. She has no wheezes. She has no rales.  Abdominal: Soft. Bowel sounds are normal. She exhibits no distension. There is no tenderness. There is no rebound, no guarding and no CVA tenderness.  Neurological: She is alert and oriented to person,  place, and time.  Skin: Skin is warm and dry.     UC Treatments / Results  Labs (all labs ordered are listed, but only abnormal results are displayed) Labs Reviewed  POCT URINALYSIS DIP (DEVICE) - Abnormal; Notable for the following:       Result Value   Protein, ur 100 (*)    Nitrite POSITIVE (*)    All other components within normal limits  URINE CULTURE    EKG  EKG Interpretation None       Radiology No results found.  Procedures Procedures (including critical care time)  Medications Ordered in UC Medications - No data to display   Initial Impression / Assessment and Plan / UC Course  I have reviewed the triage vital signs and the nursing notes.  Pertinent labs & imaging results that were available during my care of the patient were reviewed by me and considered in my medical decision making (see chart for details).    Urine dipstick positive for UTI. Start antibiotics as directed. Push fluids. Urine culture ordered. Return precautions given.   Final Clinical Impressions(s) / UC Diagnoses   Final diagnoses:  Cystitis    New Prescriptions New Prescriptions   SULFAMETHOXAZOLE-TRIMETHOPRIM (BACTRIM DS,SEPTRA DS) 800-160 MG TABLET    Take 1 tablet by mouth 2 (two) times daily.      Belinda Fisher, PA-C 06/01/17 1816

## 2017-06-01 NOTE — Discharge Instructions (Signed)
Your urine was positive for an urinary tract infection. Start Bactrim as directed. Keep hydrated, your urine should be clear to pale yellow in color. Monitor for any worsening of symptoms, fever, worsening abdominal pain, nausea/vomiting, flank pain, follow up for reevaluation.  ° °

## 2017-06-01 NOTE — ED Triage Notes (Signed)
Pt sts UTI sx with some pressure and odor when urinating

## 2017-06-03 LAB — URINE CULTURE

## 2017-11-25 ENCOUNTER — Ambulatory Visit (HOSPITAL_COMMUNITY)
Admission: EM | Admit: 2017-11-25 | Discharge: 2017-11-25 | Disposition: A | Discharge status: Home / Self Care | Payer: Medicaid Other | Attending: Family Medicine | Admitting: Family Medicine

## 2017-11-25 ENCOUNTER — Other Ambulatory Visit: Payer: Self-pay

## 2017-11-25 ENCOUNTER — Encounter (HOSPITAL_COMMUNITY): Payer: Self-pay | Admitting: Emergency Medicine

## 2017-11-25 DIAGNOSIS — H6690 Otitis media, unspecified, unspecified ear: Secondary | ICD-10-CM

## 2017-11-25 MED ORDER — FLUTICASONE PROPIONATE 50 MCG/ACT NA SUSP: 1.0000 | Freq: Every day | NASAL | 0 refills | Status: DC

## 2017-11-25 MED ORDER — AMOXICILLIN 500 MG PO CAPS: 500.0000 mg | ORAL_CAPSULE | Freq: Three times a day (TID) | ORAL | 0 refills | Status: AC

## 2017-11-25 NOTE — Discharge Instructions (Signed)
Please begin amoxicillin every 8 hours for the next week.  Please also use Flonase nasal spray, 1-2 sprays in each nostril daily.

## 2017-11-25 NOTE — ED Triage Notes (Signed)
Pt reports waking up with left ear pain this morning and feeling like she has fluid in her ear.

## 2017-11-25 NOTE — ED Provider Notes (Signed)
[facility]    CSN: [id number] Arrival date & time: 11/25/17  1347     History   Chief Complaint Chief Complaint  Patient presents with  . Otalgia    left    HPI Alicia Craig is a 27 y.o. female presenting today with left ear pain.  States that she woke up this morning with significant amount of pain in her left ear.  Feels like she has fluid on her ear.  Denies any associated cough, congestion, sore throat or rhinorrhea.  Denies fevers.  Tolerating oral intake well.  Denies any shortness of breath or chest pain.  Denies any nausea or vomiting.  HPI  Past Medical History:  Diagnosis Date  . Chlamydia 2011    Patient Active Problem List   Diagnosis Date Noted  . Active labor at term 08/15/2014  . Active labor 08/15/2014  . Previous child with Down syndrome, antepartum 05/09/2014    Past Surgical History:  Procedure Laterality Date  . DILATION AND CURETTAGE OF UTERUS      OB History    Gravida  3   Para  3   Term  3   Preterm      AB      Living  1     SAB      TAB      Ectopic      Multiple  0   Live Births  1            Home Medications    Prior to Admission medications   Medication Sig Start Date End Date Taking? Authorizing Provider  amoxicillin (AMOXIL) 500 MG capsule Take 1 capsule (500 mg total) by mouth 3 (three) times daily for 7 days. 11/25/17 12/02/17  Rayan Dyal C, PA-C  fluticasone (FLONASE) 50 MCG/ACT nasal spray Place 1-2 sprays into both nostrils daily for 7 days. 11/25/17 12/02/17  Tomasz Steeves C, PA-C  ibuprofen (ADVIL,MOTRIN) 600 MG tablet Take 1 tablet (600 mg total) by mouth every 6 (six) hours as needed. Patient not taking: Reported on 12/20/2014 08/17/14   Arabella MerlesShaw, Kimberly D, CNM  sodium chloride (OCEAN) 0.65 % SOLN nasal spray Place 2 sprays into both nostrils as needed for congestion. 12/20/14   Lurene ShadowPhelps, Erin O, PA-C    Family History Family History  Problem Relation Age of Onset  . Hypertension  Mother   . Diabetes Maternal Grandmother   . Down syndrome Daughter     Social History Social History   Tobacco Use  . Smoking status: Never Smoker  . Smokeless tobacco: Never Used  Substance Use Topics  . Alcohol use: No  . Drug use: No     Allergies   Patient has no known allergies.   Review of Systems Review of Systems  Constitutional: Negative for chills, fatigue and fever.  HENT: Positive for ear pain. Negative for congestion, rhinorrhea, sinus pressure, sore throat and trouble swallowing.   Respiratory: Negative for cough, chest tightness and shortness of breath.   Cardiovascular: Negative for chest pain.  Gastrointestinal: Negative for abdominal pain, nausea and vomiting.  Musculoskeletal: Negative for myalgias.  Skin: Negative for rash.  Neurological: Negative for dizziness, light-headedness and headaches.     Physical Exam Triage Vital Signs ED Triage Vitals [11/25/17 1404]  Enc Vitals Group     BP 139/87     Pulse Rate 89     Resp      Temp 98.1 F (36.7 C)     Temp  Source Oral     SpO2 98 %     Weight      Height      Head Circumference      Peak Flow      Pain Score 8     Pain Loc      Pain Edu?      Excl. in GC?    No data found.  Updated Vital Signs BP 139/87 (BP Location: Left Wrist)   Pulse 89   Temp 98.1 F (36.7 C) (Oral)   SpO2 98%   Visual Acuity Right Eye Distance:   Left Eye Distance:   Bilateral Distance:    Right Eye Near:   Left Eye Near:    Bilateral Near:     Physical Exam  Constitutional: She appears well-developed and well-nourished. No distress.  HENT:  Head: Normocephalic and atraumatic.  Right TM not erythematous, left TM erythematous and dull towards superior aspect, less erythematous towards inferior aspect.  Nasal mucosa erythematous without rhinorrhea  Posterior oropharynx nonerythematous without tonsillar enlargement or exudate.  Eyes: Conjunctivae are normal.  Neck: Neck supple.  Cardiovascular:  Normal rate and regular rhythm.  No murmur heard. Pulmonary/Chest: Effort normal and breath sounds normal. No respiratory distress.  Musculoskeletal: She exhibits no edema.  Neurological: She is alert.  Skin: Skin is warm and dry.  Psychiatric: She has a normal mood and affect.  Nursing note and vitals reviewed.    UC Treatments / Results  Labs (all labs ordered are listed, but only abnormal results are displayed) Labs Reviewed - No data to display  EKG None Radiology No results found.  Procedures Procedures (including critical care time)  Medications Ordered in UC Medications - No data to display   Initial Impression / Assessment and Plan / UC Course  I have reviewed the triage vital signs and the nursing notes.  Pertinent labs & imaging results that were available during my care of the patient were reviewed by me and considered in my medical decision making (see chart for details).     Otalgia likely related to otitis media vs. eustachian tube dysfunction.  Given moderate amount of erythema to TM we will go ahead and treat for otitis media with amoxicillin 3 times daily for 7 days.  Also advised Flonase nasal spray daily to help with pressure regulation if she is having any congestion.Discussed strict return precautions. Patient verbalized understanding and is agreeable with plan.   Final Clinical Impressions(s) / UC Diagnoses   Final diagnoses:  Acute otitis media, unspecified otitis media type    ED Discharge Orders        Ordered    amoxicillin (AMOXIL) 500 MG capsule  3 times daily     11/25/17 1426    fluticasone (FLONASE) 50 MCG/ACT nasal spray  Daily     11/25/17 1427       Controlled Substance Prescriptions  Controlled Substance Registry consulted? Not Applicable   Lew Dawes, [place] 11/25/17 1431

## 2018-06-15 ENCOUNTER — Encounter (HOSPITAL_COMMUNITY): Payer: Self-pay

## 2018-06-15 ENCOUNTER — Ambulatory Visit (HOSPITAL_COMMUNITY)
Admission: EM | Admit: 2018-06-15 | Discharge: 2018-06-15 | Disposition: A | Discharge status: Home / Self Care | Payer: Medicaid Other | Attending: Family Medicine | Admitting: Family Medicine

## 2018-06-15 DIAGNOSIS — Z79899 Other long term (current) drug therapy: Secondary | ICD-10-CM | POA: Insufficient documentation

## 2018-06-15 DIAGNOSIS — J029 Acute pharyngitis, unspecified: Secondary | ICD-10-CM | POA: Diagnosis present

## 2018-06-15 DIAGNOSIS — Z791 Long term (current) use of non-steroidal anti-inflammatories (NSAID): Secondary | ICD-10-CM | POA: Diagnosis not present

## 2018-06-15 DIAGNOSIS — J039 Acute tonsillitis, unspecified: Secondary | ICD-10-CM

## 2018-06-15 LAB — POCT RAPID STREP A: Streptococcus, Group A Screen (Direct): NEGATIVE

## 2018-06-15 MED ORDER — AMOXICILLIN 500 MG PO CAPS: 500.0000 mg | ORAL_CAPSULE | Freq: Two times a day (BID) | ORAL | 0 refills | Status: DC

## 2018-06-15 NOTE — Discharge Instructions (Signed)
Rapid strep negative. However, given your exam, will cover you empirically for bacterial infection with amoxicillin. As discussed, symptoms can still be due to viral illness/ drainage down your throat. This usually takes 7-10 days to resolve. You can take over the counter allergy medicine such as Flonase, Zyrtec-D for nasal congestion/drainage. You can use over the counter nasal saline rinse such as neti pot for nasal congestion. Monitor for any worsening of symptoms, swelling of the throat, trouble breathing, trouble swallowing, leaning forward to breath, drooling, go to the emergency department for further evaluation needed. ° °For sore throat/cough try using a honey-based tea. Use 3 teaspoons of honey with juice squeezed from half lemon. Place shaved pieces of ginger into 1/2-1 cup of water and warm over stove top. Then mix the ingredients and repeat every 4 hours as needed. °

## 2018-06-15 NOTE — ED Triage Notes (Signed)
Pt presents with sore throat.130/83

## 2018-06-15 NOTE — ED Provider Notes (Signed)
[facility]    CSN: [id number] Arrival date & time: 06/15/18  1532     History   Chief Complaint Chief Complaint  Patient presents with  . Sore Throat    HPI Alicia Craig is a 27 y.o. female.   27 year old female comes in for 2 day history of sore throat. Has also had subjective fever with night sweats and chills. She has occasional cough. Denies rhinorrhea, nasal congestion. Painful swallowing without trouble breathing, tripoding, drooling, trismus. otc cold medicine without relief. Never smoker. No obvious sick contact.     Past Medical History:  Diagnosis Date  . Chlamydia 2011    Patient Active Problem List   Diagnosis Date Noted  . Active labor at term 08/15/2014  . Active labor 08/15/2014  . Previous child with Down syndrome, antepartum 05/09/2014    Past Surgical History:  Procedure Laterality Date  . DILATION AND CURETTAGE OF UTERUS      OB History    Gravida  3   Para  3   Term  3   Preterm      AB      Living  1     SAB      TAB      Ectopic      Multiple  0   Live Births  1            Home Medications    Prior to Admission medications   Medication Sig Start Date End Date Taking? Authorizing Provider  amoxicillin (AMOXIL) 500 MG capsule Take 1 capsule (500 mg total) by mouth 2 (two) times daily. 06/15/18   Cathie Hoops, Amy V, PA-C  fluticasone (FLONASE) 50 MCG/ACT nasal spray Place 1-2 sprays into both nostrils daily for 7 days. 11/25/17 12/02/17  Wieters, Hallie C, PA-C  ibuprofen (ADVIL,MOTRIN) 600 MG tablet Take 1 tablet (600 mg total) by mouth every 6 (six) hours as needed. Patient not taking: Reported on 12/20/2014 08/17/14   Arabella Merles, CNM  sodium chloride (OCEAN) 0.65 % SOLN nasal spray Place 2 sprays into both nostrils as needed for congestion. 12/20/14   Lurene Shadow, PA-C    Family History Family History  Problem Relation Age of Onset  . Hypertension Mother   . Diabetes Maternal Grandmother   .  Down syndrome Daughter     Social History Social History   Tobacco Use  . Smoking status: Never Smoker  . Smokeless tobacco: Never Used  Substance Use Topics  . Alcohol use: No  . Drug use: No     Allergies   Patient has no known allergies.   Review of Systems Review of Systems  Reason unable to perform ROS: See HPI as above.     Physical Exam Triage Vital Signs ED Triage Vitals  Enc Vitals Group     BP 06/15/18 1630 130/83     Pulse Rate 06/15/18 1630 77     Resp 06/15/18 1630 20     Temp 06/15/18 1630 98.4 F (36.9 C)     Temp Source 06/15/18 1630 Oral     SpO2 06/15/18 1630 100 %     Weight --      Height --      Head Circumference --      Peak Flow --      Pain Score 06/15/18 1631 6     Pain Loc --      Pain Edu? --      Excl.  in GC? --    No data found.  Updated Vital Signs BP 130/83 (BP Location: Right Arm)   Pulse 77   Temp 98.4 F (36.9 C) (Oral)   Resp 20   SpO2 100%   Physical Exam  Constitutional: She is oriented to person, place, and time. She appears well-developed and well-nourished.  Non-toxic appearance. She does not appear ill. No distress.  HENT:  Head: Normocephalic and atraumatic.  Right Ear: Tympanic membrane, external ear and ear canal normal. Tympanic membrane is not erythematous and not bulging.  Left Ear: Tympanic membrane, external ear and ear canal normal. Tympanic membrane is not erythematous and not bulging.  Nose: Nose normal. Right sinus exhibits no maxillary sinus tenderness and no frontal sinus tenderness. Left sinus exhibits no maxillary sinus tenderness and no frontal sinus tenderness.  Mouth/Throat: Uvula is midline, oropharynx is clear and moist and mucous membranes are normal. Tonsils are 1+ on the right. Tonsils are 3+ on the left. Tonsillar exudate (left).  Eyes: Pupils are equal, round, and reactive to light. Conjunctivae are normal.  Neck: Normal range of motion. Neck supple.  Cardiovascular: Normal rate,  regular rhythm and normal heart sounds. Exam reveals no gallop and no friction rub.  No murmur heard. Pulmonary/Chest: Effort normal and breath sounds normal. She has no decreased breath sounds. She has no wheezes. She has no rhonchi. She has no rales.  Lymphadenopathy:    She has no cervical adenopathy.  Neurological: She is alert and oriented to person, place, and time.  Skin: Skin is warm and dry.  Psychiatric: She has a normal mood and affect. Her behavior is normal. Judgment normal.    UC Treatments / Results  Labs (all labs ordered are listed, but only abnormal results are displayed) Labs Reviewed  CULTURE, GROUP A STREP [facility])  POCT RAPID STREP A    EKG None  Radiology No results found.  Procedures Procedures (including critical care time)  Medications Ordered in UC Medications - No data to display  Initial Impression / Assessment and Plan / UC Course  I have reviewed the triage vital signs and the nursing notes.  Pertinent labs & imaging results that were available during my care of the patient were reviewed by me and considered in my medical decision making (see chart for details).    Rapid strep negative.  However, given history and exam, will cover for tonsillitis with amoxicillin.  Other symptomatic treatment discussed.  Return precautions given.  Patient expresses understanding and agrees to plan.  Final Clinical Impressions(s) / UC Diagnoses   Final diagnoses:  Tonsillitis    ED Prescriptions    Medication Sig Dispense Auth. Provider   amoxicillin (AMOXIL) 500 MG capsule Take 1 capsule (500 mg total) by mouth 2 (two) times daily. 20 capsule Threasa Alpha, [place] 06/15/18 1750

## 2018-06-18 LAB — CULTURE, GROUP A STREP (THRC)

## 2018-07-30 DIAGNOSIS — Z3046 Encounter for surveillance of implantable subdermal contraceptive: Secondary | ICD-10-CM | POA: Diagnosis not present

## 2018-08-03 DIAGNOSIS — Z3049 Encounter for surveillance of other contraceptives: Secondary | ICD-10-CM | POA: Diagnosis not present

## 2018-08-03 DIAGNOSIS — Z3046 Encounter for surveillance of implantable subdermal contraceptive: Secondary | ICD-10-CM | POA: Diagnosis not present

## 2018-08-18 NOTE — L&D Delivery Note (Signed)
Patient: MARITZA HOSTERMAN MRN: [id number]  GBS status: Positive, IAP given 05/26/19 @ 0611  Patient is a 28 y.o. now [id number] s/p NSVD at [redacted]w[redacted]d, who was admitted for SOL. SROM 0h 85m prior to delivery with clear fluid.    Delivery Note At 8:19 AM a viable female was delivered via Vaginal, Spontaneous (Presentation:ROA).  APGAR: 9, 9; weight pending .   Placenta status: spontaneous, intact .  Cord: 3 vessel  with the following complications: .  Cord pH: NA  Anesthesia:  Epidural  Episiotomy: None Lacerations: None Suture Repair: None  Est. Blood Loss (mL):  158  Mom to postpartum.  Baby to Couplet care / Skin to Skin.  Gifford Shave 05/26/2019, 8:38 AM  Head delivered ROA. No nuchal cord present. Shoulder and body delivered in usual fashion. Infant with spontaneous cry, placed on mother's abdomen, dried and bulb suctioned. Cord clamped x 2 after 1-minute delay, and cut by family member. Cord blood drawn. Placenta delivered spontaneously with gentle cord traction. Fundus firm with massage and Pitocin. Perineum inspected and found to have no lacerations with mild periurethral abrasions.

## 2018-09-27 ENCOUNTER — Other Ambulatory Visit: Payer: Self-pay

## 2018-09-27 ENCOUNTER — Encounter: Payer: Self-pay | Admitting: General Practice

## 2018-09-27 ENCOUNTER — Ambulatory Visit: Payer: Medicaid Other | Admitting: *Deleted

## 2018-09-27 VITALS — BP 123/76 | HR 96 | Temp 98.1°F | Ht 62.0 in | Wt 223.4 lb

## 2018-09-27 DIAGNOSIS — Z3201 Encounter for pregnancy test, result positive: Secondary | ICD-10-CM

## 2018-09-27 DIAGNOSIS — Z3200 Encounter for pregnancy test, result unknown: Secondary | ICD-10-CM

## 2018-09-27 LAB — POCT URINE PREGNANCY: Preg Test, Ur: POSITIVE — AB

## 2018-09-27 NOTE — Progress Notes (Signed)
   [name] presents today for UPT. She has no unusual complaints. LMP:07/18/2018; patient is really unsure of LMP.    OBJECTIVE: Appears well, in no apparent distress.  OB History    Gravida  4   Para  3   Term  3   Preterm      AB      Living  1     SAB      TAB      Ectopic      Multiple  0   Live Births  1          Home UPT Result: Positive In-Office UPT result: Positive I have reviewed the patient's medical, obstetrical, social, and family histories, and medications.   ASSESSMENT: Positive pregnancy test  PLAN Prenatal care to be completed at: [facility]  Clovis Pu, RN

## 2018-10-04 ENCOUNTER — Ambulatory Visit (INDEPENDENT_AMBULATORY_CARE_PROVIDER_SITE_OTHER): Payer: Medicaid Other | Admitting: *Deleted

## 2018-10-04 ENCOUNTER — Other Ambulatory Visit: Payer: Self-pay

## 2018-10-04 VITALS — BP 124/76 | HR 101 | Temp 98.0°F | Ht 62.0 in | Wt 223.6 lb

## 2018-10-04 DIAGNOSIS — Z3481 Encounter for supervision of other normal pregnancy, first trimester: Secondary | ICD-10-CM

## 2018-10-04 DIAGNOSIS — Z3A10 10 weeks gestation of pregnancy: Secondary | ICD-10-CM

## 2018-10-04 DIAGNOSIS — Z3480 Encounter for supervision of other normal pregnancy, unspecified trimester: Secondary | ICD-10-CM | POA: Diagnosis not present

## 2018-10-04 LAB — POCT URINALYSIS DIPSTICK OB
BILIRUBIN UA: NEGATIVE
GLUCOSE, UA: NEGATIVE
Leukocytes, UA: NEGATIVE
Nitrite, UA: NEGATIVE
RBC UA: NEGATIVE
Spec Grav, UA: >=1.03 — AB (ref 1.010–1.025)
Urobilinogen, UA: 0.2 E.U./dL
pH, UA: 5.5 (ref 5.0–8.0)

## 2018-10-04 MED ORDER — ONE-A-DAY WOMENS PRENATAL 1 28-0.8-235 MG PO CAPS: 1.0000 | ORAL_CAPSULE | Freq: Every day | ORAL | 0 refills | Status: DC

## 2018-10-04 MED ORDER — VITAFOL GUMMIES 3.33-0.333-34.8 MG PO CHEW: 3.0000 | CHEWABLE_TABLET | Freq: Every day | ORAL | 12 refills | Status: DC

## 2018-10-04 NOTE — Progress Notes (Signed)
   PRENATAL INTAKE SUMMARY  Ms. Breckner presents today New OB Nurse Interview.  OB History    Gravida  5   Para  3   Term  3   Preterm      AB  1   Living  3     SAB  1   TAB      Ectopic      Multiple  0   Live Births  3          I have reviewed the patient's medical, obstetrical, social, and family histories, medications, and available lab results.  SUBJECTIVE She has no unusual complaints and complains of white vaginal discharge. Denies any odor, itchy, burning or irration associated with discharge. Patient reported history of preterm labor with previous pregnancy and given injection to help stop labor.   OBJECTIVE Initial nurse interview for history and lab work (New OB).  EDD:  04/28/2019 by LMP GA: [redacted]w[redacted]d [id number] FHT: Unable to auscultate fetal heart tones.   GENERAL APPEARANCE: alert, well appearing, in no apparent distress, oriented to person, place and time, overweight.   ASSESSMENT Normal pregnancy  PLAN Prenatal care-[facility] Pnl/HIV  OB Urine Culture/Dip GC/CT/PAP at next visit with midwife HgbEval SMA/CF ([facility]) A1C Prenatal gummies sent to pharmacy. Sample of One a Day prenatal vitamin given. Ultrasound <14 weeks ordered  Genetic Screening Results Information: You are having genetic testing called [facility] today.  It will take approximately 2 weeks before the results are available.  To get your results, you need Internet access to a web browser to search [place]/MyChart (the direct app on your phone will not give you these results).  Then select Lab Scanned and click on the blue hyper link that says View Image to see your [facility] results.  You can also use the directions on the purple card given to look up your results directly on the [place] website.  Clovis Pu, RN

## 2018-10-04 NOTE — Patient Instructions (Addendum)
Genetic Screening Results Information: You are having genetic testing called [facility] today.  It will take approximately 2 weeks before the results are available.  To get your results, you need Internet access to a web browser to search [place]/MyChart (the direct app on your phone will not give you these results).  Then select Lab Scanned and click on the blue hyper link that says View Image to see your [facility] results.  You can also use the directions on the purple card given to look up your results directly on the [facility] website. Preterm Labor and Birth Information Pregnancy normally lasts 39-41 weeks. Preterm labor is when labor starts early. It starts before you have been pregnant for 37 whole weeks. What are the risk factors for preterm labor? Preterm labor is more likely to occur in women who:  Have an infection while pregnant.  Have a cervix that is short.  Have gone into preterm labor before.  Have had surgery on their cervix.  Are younger than age 30.  Are older than age 30.  Are African American.  Are pregnant with two or more babies.  Take street drugs while pregnant.  Smoke while pregnant.  Do not gain enough weight while pregnant.  Got pregnant right after another pregnancy. What are the symptoms of preterm labor? Symptoms of preterm labor include:  Cramps. The cramps may feel like the cramps some women get during their period. The cramps may happen with watery poop (diarrhea).  Pain in the belly (abdomen).  Pain in the lower back.  Regular contractions or tightening. It may feel like your belly is getting tighter.  Pressure in the lower belly that seems to get stronger.  More fluid (discharge) leaking from the vagina. The fluid may be watery or bloody.  Water breaking. Why is it important to notice signs of preterm labor? Babies who are born early may not be fully developed. They have a higher chance for:  Long-term heart problems.  Long-term  lung problems.  Trouble controlling body systems, like breathing.  Bleeding in the brain.  A condition called cerebral palsy.  Learning difficulties.  Death. These risks are highest for babies who are born before 34 weeks of pregnancy. How is preterm labor treated? Treatment depends on:  How long you were pregnant.  Your condition.  The health of your baby. Treatment may involve:  Having a stitch (suture) placed in your cervix. When you give birth, your cervix opens so the baby can come out. The stitch keeps the cervix from opening too soon.  Staying at the hospital.  Taking or getting medicines, such as: ? Hormone medicines. ? Medicines to stop contractions. ? Medicines to help the baby's lungs develop. ? Medicines to prevent your baby from having cerebral palsy. What should I do if I am in preterm labor? If you think you are going into labor too soon, call your doctor right away. How can I prevent preterm labor?  Do not use any tobacco products. ? Examples of these are cigarettes, chewing tobacco, and e-cigarettes. ? If you need help quitting, ask your doctor.  Do not use street drugs.  Do not use any medicines unless you ask your doctor if they are safe for you.  Talk with your doctor before taking any herbal supplements.  Make sure you gain enough weight.  Watch for infection. If you think you might have an infection, get it checked right away.  If you have gone into preterm labor before, tell your  doctor. This information is not intended to replace advice given to you by your health care provider. Make sure you discuss any questions you have with your health care provider. Document Released: 10/31/2008 Document Revised: 01/15/2016 Document Reviewed: 12/26/2015 Elsevier Interactive Patient Education  2019 [employer].  Warning Signs During Pregnancy A pregnancy lasts about 40 weeks, starting from the first day of your last period until the baby is born.  Pregnancy is divided into three phases called trimesters.  The first trimester refers to week 1 through week 13 of pregnancy.  The second trimester is the start of week 14 through the end of week 27.  The third trimester is the start of week 28 until you deliver your baby. During each trimester of pregnancy, certain signs and symptoms may indicate a problem. Talk with your health care provider about your current health and any medical conditions you have. Make sure you know the symptoms that you should watch for and report. How does this affect me?  Warning signs in the first trimester While some changes during the first trimester may be uncomfortable, most do not represent a serious problem. Let your health care provider know if you have any of the following warning signs in the first trimester:  You cannot eat or drink without vomiting, and this lasts for longer than a day.  You have vaginal bleeding or spotting along with menstrual-like cramping.  You have diarrhea for longer than a day.  You have a fever or other signs of infection, such as: ? Pain or burning when you urinate. ? Foul smelling or thick or yellowish vaginal discharge. Warning signs in the second trimester As your baby grows and changes during the second trimester, there are additional signs and symptoms that may indicate a problem. These include:  Signs and symptoms of infection, including a fever.  Signs or symptoms of a miscarriage or preterm labor, such as regular contractions, menstrual-like cramping, or lower abdominal pain.  Bloody or watery vaginal discharge or obvious vaginal bleeding.  Feeling like your heart is pounding.  Having trouble breathing.  Nausea, vomiting, or diarrhea that lasts for longer than a day.  Craving non-food items, such as clay, chalk, or dirt. This may be a sign of a very treatable medical condition called pica. Later in your second trimester, watch for signs and symptoms of a  serious medical condition called preeclampsia.These include:  Changes in your vision.  A severe headache that does not go away.  Nausea and vomiting. It is also important to notice if your baby stops moving or moves less than usual during this time. Warning signs in the third trimester As you approach the third trimester, your baby is growing and your body is preparing for the birth of your baby. In your third trimester, be sure to let your health care provider know if:  You have signs and symptoms of infection, including a fever.  You have vaginal bleeding.  You notice that your baby is moving less than usual or is not moving.  You have nausea, vomiting, or diarrhea that lasts for longer than a day.  You have a severe headache that does not go away.  You have vision changes, including seeing spots or having blurry or double vision.  You have increased swelling in your hands or face. How does this affect my baby? Throughout your pregnancy, always report any of the warning signs of a problem to your health care provider. This can help prevent complications that  may affect your baby, including:  Increased risk for premature birth.  Infection that may be transmitted to your baby.  Increased risk for stillbirth. Contact a health care provider if:  You have any of the warning signs of a problem for the current trimester of your pregnancy.  Any of the following apply to you during any trimester of pregnancy: ? You have strong emotions, such as sadness or anxiety, that interfere with work or personal relationships. ? You feel unsafe in your home and need help finding a safe place to live. ? You are using tobacco products, alcohol, or drugs and you need help to stop. Get help right away if: You have signs or symptoms of labor before 37 weeks of pregnancy. These include:  Contractions that are 5 minutes or less apart, or that increase in frequency, intensity, or length.  Sudden,  sharp abdominal pain or low back pain.  Uncontrolled gush or trickle of fluid from your vagina. Summary  A pregnancy lasts about 40 weeks, starting from the first day of your last period until the baby is born. Pregnancy is divided into three phases called trimesters. Each trimester has warning signs to watch for.  Always report any warning signs to your health care provider in order to prevent complications that may affect both you and your baby.  Talk with your health care provider about your current health and any medical conditions you have. Make sure you know the symptoms that you should watch for and report. This information is not intended to replace advice given to you by your health care provider. Make sure you discuss any questions you have with your health care provider. Document Released: 05/21/2017 Document Revised: 05/21/2017 Document Reviewed: 05/21/2017 Elsevier Interactive Patient Education  2019 [employer]  Genetic Testing During Pregnancy Genetic testing during pregnancy is also called prenatal genetic testing. This type of testing can determine if your baby is at risk of being born with a disorder caused by abnormal genes or chromosomes (genetic disorder). Chromosomes contain genes that control how your baby will develop in your womb. There are many different genetic disorders. Examples of genetic disorders that may be found through genetic testing include Down syndrome and cystic fibrosis. Gene changes (mutations) can be passed down through families. Genetic testing is offered to all women before or during pregnancy. You can choose whether to have genetic testing. Why is genetic testing done? Genetic testing is done during pregnancy to find out whether your child is at risk for a genetic disorder. Having genetic testing allows you to:  Discuss your test results and options with a genetic counselor.  Prepare for a baby that may be born with a genetic disorder. Learning  about the disorder ahead of time helps you be better prepared to manage it. Your health care providers can also be prepared in case your baby requires special care before or after birth.  Consider whether you want to continue with the pregnancy. In some cases, genetic testing may be done to learn about the traits a child will inherit. Types of genetic tests There are two basic types of genetic testing. Screening tests indicate whether your developing baby (fetus) is at higher risk for a genetic disorder. Diagnostic tests check actual fetal cells to diagnose a genetic disorder. Screening tests     Screening tests will not harm your baby. They are recommended for all pregnant women. Types of screening tests include:  Carrier screening. This test involves checking genes from both parents  by testing their blood or saliva. The test checks to find out if the parents carry a genetic mutation that may be passed to a baby. In most cases, both parents must carry the mutation for a baby to be at risk.  First trimester screening. This test combines a blood test with sound wave imaging of your baby (fetal ultrasound). This screening test checks for a risk of Down syndrome or other defects caused by having extra chromosomes. It also checks for defects of the heart, abdomen, or skeleton.  Second trimester screening also combines a blood test with a fetal ultrasound exam. It checks for a risk of genetic defects of the face, brain, spine, heart, or limbs.  Combined or sequential screening. This type of testing combines the results of first and second trimester screening. This type of testing may be more accurate than first or second trimester screening alone.  Cell-free DNA testing. This is a blood test that detects cells released by the placenta that get into the mother's blood. It can be used to check for a risk of Down syndrome, other extra chromosome syndromes, and disorders caused by abnormal numbers of sex  chromosomes. This test can be done any time after 10 weeks of pregnancy.  Diagnostic tests Diagnostic tests carry slight risks of problems, including bleeding, infection, and loss of the pregnancy. These tests are done only if your baby is at risk for a genetic disorder. You may meet with a genetic counselor to discuss the risks and benefits before having diagnostic tests. Examples of diagnostic tests include:  Chorionic villus sampling (CVS). This involves a procedure to remove and test a sample of cells taken from the placenta. The procedure may be done between 10 and 12 weeks of pregnancy.  Amniocentesis. This involves a procedure to remove and test a sample of fluid (amniotic fluid) and cells from the sac that surrounds the developing baby. The procedure may be done between 15 and 20 weeks of pregnancy. What do the results mean? For a screening test:  If the results are negative, it often means that your child is not at higher risk. There is still a slight chance your child could have a genetic disorder.  If the results are positive, it does not mean your child will have a genetic disorder. It may mean that your child has a higher-than-normal risk for a genetic disorder. In that case, you may want to talk with a genetic counselor about whether you should have diagnostic genetic tests. For a diagnostic test:  If the result is negative, it is unlikely that your child will have a genetic disorder.  If the test is positive for a genetic disorder, it is likely that your child will have the disorder. The test may not tell how severe the disorder will be. Talk with your health care provider about your options. Questions to ask your health care provider Before talking to your health care provider about genetic testing, find out if there is a history of genetic disorders in your family. It may also help to know your family's ethnic origins. Then ask your health care provider the following  questions:  Is my baby at risk for a genetic disorder?  What are the benefits of having genetic screening?  What tests are best for me and my baby?  What are the risks of each test?  If I get a positive result on a screening test, what is the next step?  Should I meet with a genetic  counselor before having a diagnostic test?  Should my partner or other members of my family be tested?  How much do the tests cost? Will my insurance cover the testing? Summary  Genetic testing is done during pregnancy to find out whether your child is at risk for a genetic disorder.  Genetic testing is offered to all women before or during pregnancy. You can choose whether to have genetic testing.  There are two basic types of genetic testing. Screening tests indicate whether your developing baby (fetus) is at higher risk for a genetic disorder. Diagnostic tests check actual fetal cells to diagnose a genetic disorder.  If a diagnostic genetic test is positive, talk with your health care provider about your options. This information is not intended to replace advice given to you by your health care provider. Make sure you discuss any questions you have with your health care provider. Document Released: 10/19/2017 Document Revised: 10/19/2017 Document Reviewed: 10/19/2017 Elsevier Interactive Patient Education  2019 [employer]

## 2018-10-05 ENCOUNTER — Encounter: Payer: Self-pay | Admitting: General Practice

## 2018-10-06 ENCOUNTER — Encounter: Payer: Self-pay | Admitting: General Practice

## 2018-10-06 LAB — HEMOGLOBIN A1C
Est. average glucose Bld gHb Est-mCnc: 91 mg/dL
Hgb A1c MFr Bld: 4.8 % (ref 4.8–5.6)

## 2018-10-06 LAB — HEMOGLOBINOPATHY EVALUATION
Ferritin: 229 ng/mL — ABNORMAL HIGH (ref 15–150)
HGB F QUANT: 0 % (ref 0.0–2.0)
HGB S: 0 %
Hgb A2 Quant: 2.2 % (ref 1.8–3.2)
Hgb A: 97.8 % (ref 96.4–98.8)
Hgb C: 0 %
Hgb Solubility: NEGATIVE
Hgb Variant: 0 %

## 2018-10-06 LAB — OBSTETRIC PANEL, INCLUDING HIV
ANTIBODY SCREEN: NEGATIVE
BASOS: 0 %
Basophils Absolute: 0 10*3/uL (ref 0.0–0.2)
EOS (ABSOLUTE): 0 10*3/uL (ref 0.0–0.4)
Eos: 0 %
HIV Screen 4th Generation wRfx: NONREACTIVE
Hematocrit: 34.5 % (ref 34.0–46.6)
Hemoglobin: 10.9 g/dL — ABNORMAL LOW (ref 11.1–15.9)
Hepatitis B Surface Ag: NEGATIVE
Immature Grans (Abs): 0 10*3/uL (ref 0.0–0.1)
Immature Granulocytes: 0 %
Lymphocytes Absolute: 1.9 10*3/uL (ref 0.7–3.1)
Lymphs: 34 %
MCH: 27.2 pg (ref 26.6–33.0)
MCHC: 31.6 g/dL (ref 31.5–35.7)
MCV: 86 fL (ref 79–97)
Monocytes Absolute: 0.3 10*3/uL (ref 0.1–0.9)
Monocytes: 5 %
Neutrophils Absolute: 3.5 10*3/uL (ref 1.4–7.0)
Neutrophils: 61 %
Platelets: 246 10*3/uL (ref 150–450)
RBC: 4.01 x10E6/uL (ref 3.77–5.28)
RDW: 11.9 % (ref 11.7–15.4)
RPR Ser Ql: NONREACTIVE
RUBELLA: 6.17 {index} (ref >0.99)
Rh Factor: POSITIVE
WBC: 5.7 10*3/uL (ref 3.4–10.8)

## 2018-10-07 ENCOUNTER — Telehealth: Payer: Self-pay | Admitting: *Deleted

## 2018-10-07 DIAGNOSIS — Z3480 Encounter for supervision of other normal pregnancy, unspecified trimester: Secondary | ICD-10-CM

## 2018-10-07 LAB — URINE CULTURE, OB REFLEX

## 2018-10-07 LAB — CULTURE, OB URINE

## 2018-10-07 MED ORDER — CEPHALEXIN 500 MG PO CAPS: 500.0000 mg | ORAL_CAPSULE | Freq: Four times a day (QID) | ORAL | 0 refills | Status: AC

## 2018-10-07 NOTE — Telephone Encounter (Signed)
-----   Message from [place], [place] sent at 10/06/2018  3:17 PM EST ----- Please Rx Keflex 500 mg QID x 10 days

## 2018-10-13 ENCOUNTER — Other Ambulatory Visit: Payer: Self-pay | Admitting: Obstetrics and Gynecology

## 2018-10-13 ENCOUNTER — Ambulatory Visit (HOSPITAL_COMMUNITY)
Admission: RE | Admit: 2018-10-13 | Discharge: 2018-10-13 | Disposition: A | Discharge status: Home / Self Care | Payer: Medicaid Other | Source: Ambulatory Visit | Attending: Obstetrics and Gynecology | Admitting: Obstetrics and Gynecology

## 2018-10-13 DIAGNOSIS — Z3A01 Less than 8 weeks gestation of pregnancy: Secondary | ICD-10-CM | POA: Diagnosis not present

## 2018-10-13 DIAGNOSIS — Z3480 Encounter for supervision of other normal pregnancy, unspecified trimester: Secondary | ICD-10-CM | POA: Diagnosis not present

## 2018-10-13 DIAGNOSIS — O26841 Uterine size-date discrepancy, first trimester: Secondary | ICD-10-CM | POA: Diagnosis not present

## 2018-10-14 ENCOUNTER — Encounter: Payer: Self-pay | Admitting: General Practice

## 2018-10-19 ENCOUNTER — Encounter: Payer: Self-pay | Admitting: General Practice

## 2018-10-28 ENCOUNTER — Encounter: Payer: Self-pay | Admitting: Certified Nurse Midwife

## 2018-10-28 ENCOUNTER — Ambulatory Visit (INDEPENDENT_AMBULATORY_CARE_PROVIDER_SITE_OTHER): Payer: Medicaid Other | Admitting: Certified Nurse Midwife

## 2018-10-28 ENCOUNTER — Encounter: Payer: Self-pay | Admitting: General Practice

## 2018-10-28 ENCOUNTER — Other Ambulatory Visit: Payer: Self-pay

## 2018-10-28 ENCOUNTER — Other Ambulatory Visit (HOSPITAL_COMMUNITY)
Admission: RE | Admit: 2018-10-28 | Discharge: 2018-10-28 | Disposition: A | Discharge status: Home / Self Care | Payer: Medicaid Other | Source: Ambulatory Visit | Attending: Certified Nurse Midwife | Admitting: Certified Nurse Midwife

## 2018-10-28 VITALS — BP 120/82 | HR 98 | Temp 98.2°F | Wt 230.8 lb

## 2018-10-28 DIAGNOSIS — Z3480 Encounter for supervision of other normal pregnancy, unspecified trimester: Secondary | ICD-10-CM | POA: Diagnosis not present

## 2018-10-28 DIAGNOSIS — O26899 Other specified pregnancy related conditions, unspecified trimester: Secondary | ICD-10-CM

## 2018-10-28 DIAGNOSIS — O26891 Other specified pregnancy related conditions, first trimester: Secondary | ICD-10-CM

## 2018-10-28 DIAGNOSIS — Z3A09 9 weeks gestation of pregnancy: Secondary | ICD-10-CM

## 2018-10-28 DIAGNOSIS — N898 Other specified noninflammatory disorders of vagina: Secondary | ICD-10-CM

## 2018-10-28 MED ORDER — METRONIDAZOLE 1.3 % VA GEL
1.0000 | Freq: Once | VAGINAL | 0 refills | Status: AC
Start: 2018-10-28 — End: 2018-10-28

## 2018-10-28 MED ORDER — ONE-A-DAY WOMENS PRENATAL 1 28-0.8-235 MG PO CAPS: 1.0000 | ORAL_CAPSULE | Freq: Every day | ORAL | 0 refills | Status: DC

## 2018-10-28 NOTE — Progress Notes (Signed)
Subjective:    Alicia Craig is a 28 y.o. [id number] at [redacted]w[redacted]d by early ultrasound being seen today for her first obstetrical visit.  Her obstetrical history is significant for obesity and history of PTL. Patient does intend to breast feed. She is no longer with the FOB, and is considering a BTL after delivery. She understands the need to sign papers around 28wks. Pregnancy history fully reviewed.  Patient reports occasional nausea and vomiting that does not interfere with ADLs. She was previously diagnosed with BV but unable to keep the medicine down.    Objective  HISTORY: OB History  Gravida Para Term Preterm AB Living  5 3 3  0 1 3  SAB TAB Ectopic Multiple Live Births  1 0 0 0 3    # Outcome Date GA Lbr Len/2nd Weight Sex Delivery Anes PTL Lv  5 Current           4 Term 08/15/14 [redacted]w[redacted]d 13:45 / 00:08 2849 g F Vag-Spont EPI  LIV     Apgar1: 9  Apgar5: 9  3 SAB 2013          2 Term 04/21/11 [redacted]w[redacted]d   F Vag-Spont  N LIV  1 Term 02/09/08 [redacted]w[redacted]d   M Vag-Spont  Y LIV    Past Medical History:  Diagnosis Date  . Chlamydia 2011   Past Surgical History:  Procedure Laterality Date  . DILATION AND CURETTAGE OF UTERUS     Family History  Problem Relation Age of Onset  . Hypertension Mother   . Diabetes Maternal Grandmother   . Down syndrome Daughter    Social History   Tobacco Use  . Smoking status: Never Smoker  . Smokeless tobacco: Never Used  Substance Use Topics  . Alcohol use: No  . Drug use: No   No Known Allergies Current Outpatient Medications on File Prior to Visit  Medication Sig Dispense Refill  . Prenatal Vit-Fe Phos-FA-Omega (VITAFOL GUMMIES) 3.33-0.333-34.8 MG CHEW Chew 3 each by mouth daily. 90 tablet 12  . fluticasone (FLONASE) 50 MCG/ACT nasal spray Place 1-2 sprays into both nostrils daily for 7 days. 1 g 0   No current facility-administered medications on file prior to visit.     Review of Systems Pertinent items noted in HPI and remainder of  comprehensive ROS otherwise negative.  Exam   Vitals:   10/28/18 1537  BP: 120/82  Pulse: 98  Temp: 98.2 F (36.8 C)  Weight: 104.7 kg      Uterus:  Fundal Height: 9 cm  Pelvic Exam: Perineum: no hemorrhoids, normal perineum   Vulva: normal external genitalia, no lesions   Vagina:  normal mucosa, normal discharge   Cervix: no lesions and normal, pap smear done.    Adnexa: normal adnexa and no mass, fullness, tenderness   Bony Pelvis: average  System: General: well-developed, well-nourished female in no acute distress   Breasts:  normal appearance, no masses or tenderness bilaterally   Skin: normal coloration and turgor, no rashes   Neurologic: oriented, normal, negative, normal mood   Extremities: normal strength, tone, and muscle mass, ROM of all joints is normal   HEENT PERRLA, extraocular movement intact and sclera clear, anicteric   Mouth/Teeth mucous membranes moist, pharynx normal without lesions and dental hygiene good   Neck supple and no masses   Cardiovascular: regular rate and rhythm   Respiratory:  no respiratory distress, normal breath sounds   Abdomen: soft, non-tender; bowel sounds normal; no masses,  no organomegaly   Assessment:    Pregnancy: [id number] Patient Active Problem List   Diagnosis Date Noted  . Supervision of other normal pregnancy, antepartum 10/04/2018  . Previous child with Down syndrome, antepartum 05/09/2014     Plan:    1. Supervision of other normal pregnancy, antepartum - Cytology - PAP( [place]) - Cervicovaginal ancillary only( [place]) - Prenat-Fe Carbonyl-FA-Omega 3 (ONE-A-DAY WOMENS PRENATAL 1) 28-0.8-235 MG CAPS; Take 1 capsule by mouth daily.  Dispense: 30 capsule; Refill: 0 - Redraw [facility]  2. Vaginal discharge during pregnancy, antepartum - metroNIDAZOLE (NUVESSA) 1.3 % GEL; Place 1 Tube vaginally once for 1 dose.  Dispense: 5 g; Refill: 0   Initial labs reviewed. Prenatal vitamin samples given. Genetic  Screening discussed, NIPS: requested. Ultrasound discussed; fetal anatomic survey: requested. Problem list reviewed and updated. The nature of [place] [facility] with multiple MDs and other Advanced Practice Providers was explained to patient; also emphasized that residents, students are part of our team. Routine obstetric precautions reviewed. Return in about 4 weeks (around 11/25/2018) for ROB.    Edd Arbour, SNM

## 2018-11-03 LAB — CERVICOVAGINAL ANCILLARY ONLY
Bacterial vaginitis: NEGATIVE
Candida vaginitis: POSITIVE — AB
Chlamydia: NEGATIVE
Neisseria Gonorrhea: NEGATIVE
Trichomonas: NEGATIVE

## 2018-11-03 MED ORDER — TERCONAZOLE 0.8 % VA CREA: 1.0000 | TOPICAL_CREAM | Freq: Every day | VAGINAL | 0 refills | Status: DC

## 2018-11-03 NOTE — Addendum Note (Signed)
Addended by: Sharyon Cable on: 11/03/2018 09:38 AM   Modules accepted: Orders

## 2018-11-05 LAB — CYTOLOGY - PAP: Diagnosis: NEGATIVE

## 2018-11-15 ENCOUNTER — Telehealth: Payer: Self-pay | Admitting: General Practice

## 2018-11-15 NOTE — Telephone Encounter (Signed)
Unable to reach pt via phone and wasn't able to leave a VM.  Message sent to to Mychart in regards to appt on 11/25/2018.

## 2018-11-22 ENCOUNTER — Telehealth: Payer: Self-pay | Admitting: General Practice

## 2018-11-22 NOTE — Telephone Encounter (Signed)
Pt aware of Telehealth visit on 11/25/2018 at 2:50pm.  Pt do not have means to get BP cuff.  Asked pt to stop office today and we will hand it to her at the door.  Pt verbalized understanding.

## 2018-11-25 ENCOUNTER — Encounter: Payer: Self-pay | Admitting: Obstetrics and Gynecology

## 2018-11-25 ENCOUNTER — Other Ambulatory Visit: Payer: Self-pay

## 2018-11-25 ENCOUNTER — Ambulatory Visit (INDEPENDENT_AMBULATORY_CARE_PROVIDER_SITE_OTHER): Payer: Medicaid Other | Admitting: Obstetrics and Gynecology

## 2018-11-25 DIAGNOSIS — Z3A13 13 weeks gestation of pregnancy: Secondary | ICD-10-CM | POA: Diagnosis not present

## 2018-11-25 DIAGNOSIS — Z3481 Encounter for supervision of other normal pregnancy, first trimester: Secondary | ICD-10-CM | POA: Diagnosis not present

## 2018-11-25 DIAGNOSIS — Z3480 Encounter for supervision of other normal pregnancy, unspecified trimester: Secondary | ICD-10-CM

## 2018-11-25 NOTE — Progress Notes (Signed)
   TELEHEALTH VIRTUAL OBSTETRICS VISIT ENCOUNTER NOTE  I connected with Alicia Craig on 11/25/18 at  2:50 PM EDT by telephone at home and verified that I am speaking with the correct person using two identifiers.   I discussed the limitations, risks, security and privacy concerns of performing an evaluation and management service by telephone and the availability of in person appointments. I also discussed with the patient that there may be a patient responsible charge related to this service. The patient expressed understanding and agreed to proceed.  Subjective:  Alicia Craig is a 28 y.o. [id number] at [redacted]w[redacted]d being followed for ongoing prenatal care.  She is currently monitored for the following issues for this low-risk pregnancy and has Previous child with Down syndrome, antepartum and Supervision of other normal pregnancy, antepartum on their problem list.  Patient reports no complaints. Reports fetal movement. Denies any contractions, bleeding or leaking of fluid.   The following portions of the patient's history were reviewed and updated as appropriate: allergies, current medications, past family history, past medical history, past social history, past surgical history and problem list.   Objective:   General:  Alert, oriented and cooperative.   Mental Status: Normal mood and affect perceived. Normal judgment and thought content.  Rest of physical exam deferred due to type of encounter  Assessment and Plan:  Pregnancy: [id number] at [redacted]w[redacted]d 1. Supervision of other normal pregnancy, antepartum - Desires BTL  discussed the consent for BTL papers were  - Discussed telehealth visits and abbreviated OB schedule during COVID-19 - [place] MFM OB COMP + 14 WK; Future - Advised there is a BP cuff at the office for her to have; patient states that she "will try to come here today to pick up the BP cuff"  Preterm labor symptoms and general obstetric precautions including but not limited to  vaginal bleeding, contractions, leaking of fluid and fetal movement were reviewed in detail with the patient.  I discussed the assessment and treatment plan with the patient. The patient was provided an opportunity to ask questions and all were answered. The patient agreed with the plan and demonstrated an understanding of the instructions. The patient was advised to call back or seek an in-person office evaluation/go to [facility] at [facility] for any urgent or concerning symptoms. Please refer to After Visit Summary for other counseling recommendations.   I provided 10 minutes of non-face-to-face time during this encounter.  Return in about 7 weeks (around 01/13/2019) for Return OB - webex/telehealth.  Future Appointments  Date Time Provider Department Center  01/13/2019  2:30 PM Raelyn Mora, CNM [facility] None    Raelyn Mora, CNM [facility] [employer], [facility]

## 2018-11-29 ENCOUNTER — Telehealth: Payer: Self-pay | Admitting: General Practice

## 2018-11-29 NOTE — Telephone Encounter (Signed)
Pt aware of [place] scheduled for 01/05/2019 at 10:45am.

## 2018-12-08 ENCOUNTER — Other Ambulatory Visit: Payer: Self-pay | Admitting: *Deleted

## 2018-12-08 DIAGNOSIS — Z3480 Encounter for supervision of other normal pregnancy, unspecified trimester: Secondary | ICD-10-CM

## 2018-12-08 MED ORDER — BLOOD PRESSURE MONITOR AUTOMAT DEVI: 0 refills | Status: DC

## 2018-12-08 NOTE — Progress Notes (Signed)
Rx sent for blood pressure monitor to [facility].

## 2018-12-13 DIAGNOSIS — R030 Elevated blood-pressure reading, without diagnosis of hypertension: Secondary | ICD-10-CM | POA: Diagnosis not present

## 2018-12-13 DIAGNOSIS — Z3480 Encounter for supervision of other normal pregnancy, unspecified trimester: Secondary | ICD-10-CM | POA: Diagnosis not present

## 2019-01-05 ENCOUNTER — Ambulatory Visit (HOSPITAL_COMMUNITY)
Admission: RE | Admit: 2019-01-05 | Discharge: 2019-01-05 | Disposition: A | Discharge status: Home / Self Care | Payer: Medicaid Other | Source: Ambulatory Visit | Attending: Obstetrics and Gynecology | Admitting: Obstetrics and Gynecology

## 2019-01-05 ENCOUNTER — Other Ambulatory Visit: Payer: Self-pay

## 2019-01-05 ENCOUNTER — Telehealth (HOSPITAL_COMMUNITY): Payer: Self-pay | Admitting: *Deleted

## 2019-01-05 ENCOUNTER — Other Ambulatory Visit: Payer: Self-pay | Admitting: Obstetrics and Gynecology

## 2019-01-05 DIAGNOSIS — O99212 Obesity complicating pregnancy, second trimester: Secondary | ICD-10-CM | POA: Diagnosis not present

## 2019-01-05 DIAGNOSIS — Z363 Encounter for antenatal screening for malformations: Secondary | ICD-10-CM | POA: Diagnosis not present

## 2019-01-05 DIAGNOSIS — Z3480 Encounter for supervision of other normal pregnancy, unspecified trimester: Secondary | ICD-10-CM | POA: Insufficient documentation

## 2019-01-05 DIAGNOSIS — Z3A19 19 weeks gestation of pregnancy: Secondary | ICD-10-CM

## 2019-01-05 NOTE — Telephone Encounter (Signed)
Pt returned call.  Name and DOB verified.  Discussed that due to [facility] change initial panorama was drawn too early.  Dr. Judeth Cornfield would like to have panorama redrawn.  Pt agrees and will plan to come in on Friday for redraw.

## 2019-01-06 ENCOUNTER — Other Ambulatory Visit (HOSPITAL_COMMUNITY): Payer: Self-pay | Admitting: Obstetrics and Gynecology

## 2019-01-06 DIAGNOSIS — IMO0002 Reserved for concepts with insufficient information to code with codable children: Secondary | ICD-10-CM

## 2019-01-06 DIAGNOSIS — Z0489 Encounter for examination and observation for other specified reasons: Secondary | ICD-10-CM

## 2019-01-06 DIAGNOSIS — Z3A23 23 weeks gestation of pregnancy: Secondary | ICD-10-CM

## 2019-01-07 ENCOUNTER — Ambulatory Visit (HOSPITAL_COMMUNITY): Payer: Medicaid Other

## 2019-01-11 ENCOUNTER — Other Ambulatory Visit: Payer: Medicaid Other

## 2019-01-11 ENCOUNTER — Other Ambulatory Visit: Payer: Self-pay

## 2019-01-11 ENCOUNTER — Ambulatory Visit (HOSPITAL_COMMUNITY): Payer: Medicaid Other | Attending: Obstetrics and Gynecology

## 2019-01-11 DIAGNOSIS — Z3482 Encounter for supervision of other normal pregnancy, second trimester: Secondary | ICD-10-CM | POA: Diagnosis not present

## 2019-01-11 DIAGNOSIS — Z3480 Encounter for supervision of other normal pregnancy, unspecified trimester: Secondary | ICD-10-CM

## 2019-01-13 ENCOUNTER — Telehealth: Payer: Medicaid Other | Admitting: Obstetrics and Gynecology

## 2019-01-13 ENCOUNTER — Encounter: Payer: Medicaid Other | Admitting: Obstetrics and Gynecology

## 2019-01-19 ENCOUNTER — Telehealth: Payer: Medicaid Other | Admitting: Obstetrics and Gynecology

## 2019-01-20 ENCOUNTER — Other Ambulatory Visit (HOSPITAL_COMMUNITY): Payer: Self-pay | Admitting: Maternal & Fetal Medicine

## 2019-01-21 ENCOUNTER — Telehealth (HOSPITAL_COMMUNITY): Payer: Self-pay | Admitting: *Deleted

## 2019-01-26 ENCOUNTER — Other Ambulatory Visit: Payer: Self-pay

## 2019-01-26 ENCOUNTER — Ambulatory Visit (INDEPENDENT_AMBULATORY_CARE_PROVIDER_SITE_OTHER): Payer: Medicaid Other | Admitting: Obstetrics and Gynecology

## 2019-01-26 ENCOUNTER — Encounter: Payer: Self-pay | Admitting: Obstetrics and Gynecology

## 2019-01-26 VITALS — BP 117/73 | HR 99

## 2019-01-26 DIAGNOSIS — Z3480 Encounter for supervision of other normal pregnancy, unspecified trimester: Secondary | ICD-10-CM

## 2019-01-26 DIAGNOSIS — R102 Pelvic and perineal pain: Secondary | ICD-10-CM

## 2019-01-26 DIAGNOSIS — O26892 Other specified pregnancy related conditions, second trimester: Secondary | ICD-10-CM

## 2019-01-26 DIAGNOSIS — Z3A22 22 weeks gestation of pregnancy: Secondary | ICD-10-CM

## 2019-01-26 MED ORDER — COMFORT FIT MATERNITY SUPP LG MISC: 1.0000 [IU] | Freq: Every day | 0 refills | Status: DC

## 2019-01-26 NOTE — Patient Instructions (Signed)
Abdominal Pain During Pregnancy ° °Abdominal pain is common during pregnancy, and has many possible causes. Some causes are more serious than others, and sometimes the cause is not known. Abdominal pain can be a sign that labor is starting. It can also be caused by normal growth and stretching of muscles and ligaments during pregnancy. Always tell your health care provider if you have any abdominal pain. °Follow these instructions at home: °· Do not have sex or put anything in your vagina until your pain goes away completely. °· Get plenty of rest until your pain improves. °· Drink enough fluid to keep your urine pale yellow. °· Take over-the-counter and prescription medicines only as told by your health care provider. °· Keep all follow-up visits as told by your health care provider. This is important. °Contact a health care provider if: °· Your pain continues or gets worse after resting. °· You have lower abdominal pain that: °? Comes and goes at regular intervals. °? Spreads to your back. °? Is similar to menstrual cramps. °· You have pain or burning when you urinate. °Get help right away if: °· You have a fever or chills. °· You have vaginal bleeding. °· You are leaking fluid from your vagina. °· You are passing tissue from your vagina. °· You have vomiting or diarrhea that lasts for more than 24 hours. °· Your baby is moving less than usual. °· You feel very weak or faint. °· You have shortness of breath. °· You develop severe pain in your upper abdomen. °Summary °· Abdominal pain is common during pregnancy, and has many possible causes. °· If you experience abdominal pain during pregnancy, tell your health care provider right away. °· Follow your health care provider's home care instructions and keep all follow-up visits as directed. °This information is not intended to replace advice given to you by your health care provider. Make sure you discuss any questions you have with your health care  provider. °Document Released: 08/04/2005 Document Revised: 11/06/2016 Document Reviewed: 11/06/2016 °Elsevier Interactive Patient Education © 2019 Elsevier Inc. ° °

## 2019-01-26 NOTE — Progress Notes (Signed)
   [place] VIRTUAL OBSTETRICS VISIT ENCOUNTER NOTE  I connected with [name] on 01/26/19 at  4:10 PM EDT by Webex at home and verified that I am speaking with the correct person using two identifiers.   I discussed the limitations, risks, security and privacy concerns of performing an evaluation and management service by Webex and the availability of in person appointments. I also discussed with the patient that there may be a patient responsible charge related to this service. The patient expressed understanding and agreed to proceed.  Subjective:  Alicia Craig is a 28 y.o. [phone] at [redacted]w[redacted]d being followed for ongoing prenatal care.  She is currently monitored for the following issues for this low-risk pregnancy and has Previous child with Down syndrome, antepartum and Supervision of other normal pregnancy, antepartum on their problem list.  Patient reports pelvic pressure with extended periods of time walking. Reports fetal movement. Denies any contractions, bleeding or leaking of fluid.   The following portions of the patient's history were reviewed and updated as appropriate: allergies, current medications, past family history, past medical history, past social history, past surgical history and problem list.   Objective:   General:  Alert, oriented and cooperative.   Mental Status: Normal mood and affect perceived. Normal judgment and thought content.  Rest of physical exam deferred due to type of encounter BP 117/73   Pulse 99   LMP 07/18/2018 (Approximate) Taken by pt at home Assessment and Plan:  Pregnancy: [id number] at [redacted]w[redacted]d 1. Supervision of other normal pregnancy, antepartum - Anticipatory guidance given about 2 hr GTT in 6 wks  reminded will need to be fasting after midnight the night before her appt  2. Pelvic pain affecting pregnancy in second trimester, antepartum  - Rx Elastic Bandages & Supports (COMFORT FIT MATERNITY SUPP LG) MISC  pt will p/u from office    Preterm labor symptoms and general obstetric precautions including but not limited to vaginal bleeding, contractions, leaking of fluid and fetal movement were reviewed in detail with the patient.  I discussed the assessment and treatment plan with the patient. The patient was provided an opportunity to ask questions and all were answered. The patient agreed with the plan and demonstrated an understanding of the instructions. The patient was advised to call back or seek an in-person office evaluation/go to MAU at [facility] for any urgent or concerning symptoms. Please refer to After Visit Summary for other counseling recommendations.   I provided 10 minutes of non-face-to-face time during this encounter. There was 5 minutes of chart review time spent prior to this encounter. Total time spent = 15 minutes.  Return in about 4 weeks (around 02/23/2019) for Return OB - My Chart video.  Future Appointments  Date Time Provider [place]  02/02/2019 11:15 AM [place] 4 WH-MFCUS MFC-US  02/24/2019  4:10 PM Laury Deep, CNM [facility] None  03/11/2019  8:10 AM Laury Deep, CNM [facility] None    Laury Deep, [place] for [employer], [place]

## 2019-01-27 DIAGNOSIS — O339 Maternal care for disproportion, unspecified: Secondary | ICD-10-CM | POA: Diagnosis not present

## 2019-02-02 ENCOUNTER — Ambulatory Visit (HOSPITAL_COMMUNITY)
Admission: RE | Admit: 2019-02-02 | Discharge: 2019-02-02 | Disposition: A | Discharge status: Home / Self Care | Payer: Medicaid Other | Source: Ambulatory Visit | Attending: Obstetrics and Gynecology | Admitting: Obstetrics and Gynecology

## 2019-02-02 ENCOUNTER — Other Ambulatory Visit (HOSPITAL_COMMUNITY): Payer: Self-pay | Admitting: *Deleted

## 2019-02-02 ENCOUNTER — Other Ambulatory Visit: Payer: Self-pay

## 2019-02-02 DIAGNOSIS — IMO0002 Reserved for concepts with insufficient information to code with codable children: Secondary | ICD-10-CM

## 2019-02-02 DIAGNOSIS — O99212 Obesity complicating pregnancy, second trimester: Secondary | ICD-10-CM

## 2019-02-02 DIAGNOSIS — Z362 Encounter for other antenatal screening follow-up: Secondary | ICD-10-CM | POA: Diagnosis not present

## 2019-02-02 DIAGNOSIS — Z3A23 23 weeks gestation of pregnancy: Secondary | ICD-10-CM | POA: Diagnosis not present

## 2019-02-02 DIAGNOSIS — O99210 Obesity complicating pregnancy, unspecified trimester: Secondary | ICD-10-CM

## 2019-02-02 DIAGNOSIS — Z0489 Encounter for examination and observation for other specified reasons: Secondary | ICD-10-CM | POA: Diagnosis not present

## 2019-02-24 ENCOUNTER — Other Ambulatory Visit: Payer: Self-pay

## 2019-02-24 ENCOUNTER — Ambulatory Visit (INDEPENDENT_AMBULATORY_CARE_PROVIDER_SITE_OTHER): Payer: Medicaid Other | Admitting: Obstetrics and Gynecology

## 2019-02-24 VITALS — BP 108/76 | HR 108

## 2019-02-24 DIAGNOSIS — Z3480 Encounter for supervision of other normal pregnancy, unspecified trimester: Secondary | ICD-10-CM

## 2019-02-24 DIAGNOSIS — Z3A26 26 weeks gestation of pregnancy: Secondary | ICD-10-CM

## 2019-02-24 DIAGNOSIS — Z3482 Encounter for supervision of other normal pregnancy, second trimester: Secondary | ICD-10-CM

## 2019-02-25 ENCOUNTER — Encounter: Payer: Self-pay | Admitting: Obstetrics and Gynecology

## 2019-02-25 NOTE — Progress Notes (Signed)
   MY CHART VIDEO VIRTUAL OBSTETRICS VISIT ENCOUNTER NOTE  I connected with Alicia Craig on 02/24/19 at  4:10 PM EDT by My Chart video at home and verified that I am speaking with the correct person using two identifiers.   I discussed the limitations, risks, security and privacy concerns of performing an evaluation and management service by My Chart video and the availability of in person appointments. I also discussed with the patient that there may be a patient responsible charge related to this service. The patient expressed understanding and agreed to proceed.  Subjective:  Alicia Craig is a 28 y.o. [phone] at [redacted]w[redacted]d being followed for ongoing prenatal care.  She is currently monitored for the following issues for this low-risk pregnancy and has Previous child with Down syndrome, antepartum and Supervision of other normal pregnancy, antepartum on their problem list.  Patient reports no complaints. Reports fetal movement. Denies any contractions, bleeding or leaking of fluid.   The following portions of the patient's history were reviewed and updated as appropriate: allergies, current medications, past family history, past medical history, past social history, past surgical history and problem list.   Objective:   General:  Alert, oriented and cooperative.   Mental Status: Normal mood and affect perceived. Normal judgment and thought content.  Rest of physical exam deferred due to type of encounter  BP 108/76   Pulse (!) 108   LMP 07/18/2018 (Approximate) Comment: Patient is really unsure of LMP **Done my patient's own at home BP cuff and scale  Assessment and Plan:  Pregnancy: [id number] at [redacted]w[redacted]d  Supervision of other normal pregnancy, antepartum  - Anticipatory guidance for 2 hr GTT nv  Preterm labor symptoms and general obstetric precautions including but not limited to vaginal bleeding, contractions, leaking of fluid and fetal movement were reviewed in detail with the  patient.  I discussed the assessment and treatment plan with the patient. The patient was provided an opportunity to ask questions and all were answered. The patient agreed with the plan and demonstrated an understanding of the instructions. The patient was advised to call back or seek an in-person office evaluation/go to MAU at [facility] for any urgent or concerning symptoms. Please refer to After Visit Summary for other counseling recommendations.   I provided 5 minutes of non-face-to-face time during this encounter. There was 5 minutes of chart review time spent prior to this encounter. Total time spent = 10 minutes.  Return in about 2 weeks (around 03/10/2019) for Return OB 2hr GTT.  Future Appointments  Date Time Provider [place]  02/28/2019  9:30 AM [place] None  03/09/2019 11:00 AM [facility] [place] 3 WH-MFCUS MFC-US  03/11/2019  8:10 AM Laury Deep, CNM [facility] None    Laury Deep, [place] for [employer], [place] [facility]

## 2019-02-28 ENCOUNTER — Ambulatory Visit: Payer: Medicaid Other

## 2019-03-02 ENCOUNTER — Telehealth: Payer: Self-pay | Admitting: General Practice

## 2019-03-02 NOTE — Telephone Encounter (Signed)
Left message on VM for patient to give our office a call in regards to appointment change.  Pt was scheduled on Friday, 03/11/2019 and rescheduled to Thursday, 03/10/2019 at 8:50am d/t provider not available.

## 2019-03-04 ENCOUNTER — Encounter: Payer: Medicaid Other | Admitting: Family

## 2019-03-09 ENCOUNTER — Other Ambulatory Visit (HOSPITAL_COMMUNITY): Payer: Self-pay | Admitting: *Deleted

## 2019-03-09 ENCOUNTER — Ambulatory Visit (HOSPITAL_COMMUNITY)
Admission: RE | Admit: 2019-03-09 | Discharge: 2019-03-09 | Disposition: A | Discharge status: Home / Self Care | Payer: Medicaid Other | Source: Ambulatory Visit | Attending: Obstetrics and Gynecology | Admitting: Obstetrics and Gynecology

## 2019-03-09 ENCOUNTER — Other Ambulatory Visit: Payer: Self-pay

## 2019-03-09 DIAGNOSIS — Z362 Encounter for other antenatal screening follow-up: Secondary | ICD-10-CM

## 2019-03-09 DIAGNOSIS — O99212 Obesity complicating pregnancy, second trimester: Secondary | ICD-10-CM

## 2019-03-09 DIAGNOSIS — Z3A28 28 weeks gestation of pregnancy: Secondary | ICD-10-CM | POA: Diagnosis not present

## 2019-03-09 DIAGNOSIS — O99210 Obesity complicating pregnancy, unspecified trimester: Secondary | ICD-10-CM | POA: Insufficient documentation

## 2019-03-10 ENCOUNTER — Encounter: Payer: Medicaid Other | Admitting: Obstetrics and Gynecology

## 2019-03-11 ENCOUNTER — Encounter: Payer: Medicaid Other | Admitting: Obstetrics and Gynecology

## 2019-03-17 ENCOUNTER — Other Ambulatory Visit (HOSPITAL_COMMUNITY)
Admission: RE | Admit: 2019-03-17 | Discharge: 2019-03-17 | Disposition: A | Discharge status: Home / Self Care | Payer: Medicaid Other | Source: Ambulatory Visit | Attending: Obstetrics and Gynecology | Admitting: Obstetrics and Gynecology

## 2019-03-17 ENCOUNTER — Encounter: Payer: Self-pay | Admitting: General Practice

## 2019-03-17 ENCOUNTER — Ambulatory Visit (INDEPENDENT_AMBULATORY_CARE_PROVIDER_SITE_OTHER): Payer: Medicaid Other | Admitting: Obstetrics and Gynecology

## 2019-03-17 ENCOUNTER — Other Ambulatory Visit: Payer: Self-pay

## 2019-03-17 VITALS — BP 115/70 | HR 104 | Temp 98.5°F | Wt 225.2 lb

## 2019-03-17 DIAGNOSIS — Z3A29 29 weeks gestation of pregnancy: Secondary | ICD-10-CM | POA: Diagnosis not present

## 2019-03-17 DIAGNOSIS — N898 Other specified noninflammatory disorders of vagina: Secondary | ICD-10-CM

## 2019-03-17 DIAGNOSIS — Z23 Encounter for immunization: Secondary | ICD-10-CM

## 2019-03-17 DIAGNOSIS — O26893 Other specified pregnancy related conditions, third trimester: Secondary | ICD-10-CM

## 2019-03-17 DIAGNOSIS — Z3480 Encounter for supervision of other normal pregnancy, unspecified trimester: Secondary | ICD-10-CM

## 2019-03-17 DIAGNOSIS — Z3483 Encounter for supervision of other normal pregnancy, third trimester: Secondary | ICD-10-CM

## 2019-03-17 DIAGNOSIS — O26899 Other specified pregnancy related conditions, unspecified trimester: Secondary | ICD-10-CM | POA: Insufficient documentation

## 2019-03-17 NOTE — Progress Notes (Signed)
   PRENATAL VISIT NOTE  Subjective:  Alicia Craig is a 28 y.o. [phone] at [redacted]w[redacted]d being seen today for ongoing prenatal care.  She is currently monitored for the following issues for this low-risk pregnancy and has Previous child with Down syndrome, antepartum and Supervision of other normal pregnancy, antepartum on their problem list.  Patient reports occasional contractions and thick, white, itchy vaginal discharge. She has tried OTC meds without relief.  Contractions: Irregular. Vag. Bleeding: None.  Movement: Present. Denies leaking of fluid.   The following portions of the patient's history were reviewed and updated as appropriate: allergies, current medications, past family history, past medical history, past social history, past surgical history and problem list.   Objective:   Vitals:   03/17/19 0854  BP: 115/70  Pulse: (!) 104  Temp: 98.5 F (36.9 C)  Weight: 225 lb 3.2 oz (102.2 kg)    Fetal Status: Fetal Heart Rate (bpm): 155 Fundal Height: 30 cm Movement: Present     General:  Alert, oriented and cooperative. Patient is in no acute distress.  Skin: Skin is warm and dry. No rash noted.   Cardiovascular: Normal heart rate noted  Respiratory: Normal respiratory effort, no problems with respiration noted  Abdomen: Soft, gravid, appropriate for gestational age.  Pain/Pressure: Present     Pelvic: Cervical exam deferred      Wet Prep obtained by self-swab done by patient  Extremities: Normal range of motion.  Edema: None  Mental Status: Normal mood and affect. Normal behavior. Normal judgment and thought content.   Assessment and Plan:  Pregnancy: [id number] at [redacted]w[redacted]d 1. Supervision of other normal pregnancy, antepartum - Tdap vaccine greater than or equal to 7yo IM - CBC - HIV Antibody (routine testing w rflx) - RPR - Glucose Tolerance, 2 Hours w/1 Hour - Discussed updated visitation and doula policy for [facility] & MAU - Keep scheduled F/U U/S on 04/06/19  2. Need for  tetanus, diphtheria, and acellular pertussis (Tdap) vaccine in patient of adolescent age or older - Tdap vaccine greater than or equal to 7yo IM  3. Vaginal discharge during pregnancy, antepartum - Cervicovaginal ancillary only( [place])  Preterm labor symptoms and general obstetric precautions including but not limited to vaginal bleeding, contractions, leaking of fluid and fetal movement were reviewed in detail with the patient. Please refer to After Visit Summary for other counseling recommendations.   Return in about 4 weeks (around 04/14/2019) for Return OB - My Chart video.  Future Appointments  Date Time Provider [place]  04/06/2019 12:45 PM [place] 2 WH-MFCUS MFC-US  04/07/2019  4:10 PM Laury Deep, [place] None    Laury Deep, [place]

## 2019-03-17 NOTE — Patient Instructions (Signed)
Fetal Movement Counts Patient Name: ________________________________________________ Patient Due Date: ____________________ What is a fetal movement count?  Braxton Hicks Contractions Contractions of the uterus can occur throughout pregnancy, but they are not always a sign that you are in labor. You may have practice contractions called Braxton Hicks contractions. These false labor contractions are sometimes confused with true labor. What are [place] contractions? Braxton Hicks contractions are tightening movements that occur in the muscles of the uterus before labor. Unlike true labor contractions, these contractions do not result in opening (dilation) and thinning of the cervix. Toward the end of pregnancy (32-34 weeks), Braxton Hicks contractions can happen more often and may become stronger. These contractions are sometimes difficult to tell apart from true labor because they can be very uncomfortable. You should not feel embarrassed if you go to the hospital with false labor. Sometimes, the only way to tell if you are in true labor is for your health care provider to look for changes in the cervix. The health care provider will do a physical exam and may monitor your contractions. If you are not in true labor, the exam should show that your cervix is not dilating and your water has not broken. If there are no other health problems associated with your pregnancy, it is completely safe for you to be sent home with false labor. You may continue to have Braxton Hicks contractions until you go into true labor. How to tell the difference between true labor and false labor True labor  Contractions last 30-70 seconds.  Contractions become very regular.  Discomfort is usually felt in the top of the uterus, and it spreads to the lower abdomen and low back.  Contractions do not go away with walking.  Contractions usually become more intense and increase in frequency.  The cervix dilates and  gets thinner. False labor  Contractions are usually shorter and not as strong as true labor contractions.  Contractions are usually irregular.  Contractions are often felt in the front of the lower abdomen and in the groin.  Contractions may go away when you walk around or change positions while lying down.  Contractions get weaker and are shorter-lasting as time goes on.  The cervix usually does not dilate or become thin. Follow these instructions at home:   Take over-the-counter and prescription medicines only as told by your health care provider.  Keep up with your usual exercises and follow other instructions from your health care provider.  Eat and drink lightly if you think you are going into labor.  If Braxton Hicks contractions are making you uncomfortable: ? Change your position from lying down or resting to walking, or change from walking to resting. ? Sit and rest in a tub of warm water. ? Drink enough fluid to keep your urine pale yellow. Dehydration may cause these contractions. ? Do slow and deep breathing several times an hour.  Keep all follow-up prenatal visits as told by your health care provider. This is important. Contact a health care provider if:  You have a fever.  You have continuous pain in your abdomen. Get help right away if:  Your contractions become stronger, more regular, and closer together.  You have fluid leaking or gushing from your vagina.  You pass blood-tinged mucus (bloody show).  You have bleeding from your vagina.  You have low back pain that you never had before.  You feel your baby's head pushing down and causing pelvic pressure.  Your baby is not  moving inside you as much as it used to. Summary  Contractions that occur before labor are called Braxton Hicks contractions, false labor, or practice contractions.  Braxton Hicks contractions are usually shorter, weaker, farther apart, and less regular than true labor  contractions. True labor contractions usually become progressively stronger and regular, and they become more frequent.  Manage discomfort from [facility] Health[facility]xton Hicks contractions by changing position, resting in a warm bath, drinking plenty of water, or practicing deep breathing. This information is not intended to replace advice given to you by your health care provider. Make sure you discuss any questions you have with your health care provider. Document Released: 12/18/2016 Document Revised: 07/17/2017 Document Reviewed: 12/18/2016 Elsevier Patient Education  2020 [employer]  A fetal movement count is the number of times that you feel your baby move during a certain amount of time. This may also be called a fetal kick count. A fetal movement count is recommended for every pregnant woman. You may be asked to start counting fetal movements as early as week 28 of your pregnancy. Pay attention to when your baby is most active. You may notice your baby's sleep and wake cycles. You may also notice things that make your baby move more. You should do a fetal movement count:  When your baby is normally most active.  At the same time each day. A good time to count movements is while you are resting, after having something to eat and drink. How do I count fetal movements? 1. Find a quiet, comfortable area. Sit, or lie down on your side. 2. Write down the date, the start time and stop time, and the number of movements that you felt between those two times. Take this information with you to your health care visits. 3. For 2 hours, count kicks, flutters, swishes, rolls, and jabs. You should feel at least 10 movements during 2 hours. 4. You may stop counting after you have felt 10 movements. 5. If you do not feel 10 movements in 2 hours, have something to eat and drink. Then, keep resting and counting for 1 hour. If you feel at least 4 movements during that hour, you may stop counting. Contact a health care  provider if:  You feel fewer than 4 movements in 2 hours.  Your baby is not moving like he or she usually does. Date: ____________ Start time: ____________ Stop time: ____________ Movements: ____________ Date: ____________ Start time: ____________ Stop time: ____________ Movements: ____________ Date: ____________ Start time: ____________ Stop time: ____________ Movements: ____________ Date: ____________ Start time: ____________ Stop time: ____________ Movements: ____________ Date: ____________ Start time: ____________ Stop time: ____________ Movements: ____________ Date: ____________ Start time: ____________ Stop time: ____________ Movements: ____________ Date: ____________ Start time: ____________ Stop time: ____________ Movements: ____________ Date: ____________ Start time: ____________ Stop time: ____________ Movements: ____________ Date: ____________ Start time: ____________ Stop time: ____________ Movements: ____________ This information is not intended to replace advice given to you by your health care provider. Make sure you discuss any questions you have with your health care provider. Document Released: 09/03/2006 Document Revised: 08/24/2018 Document Reviewed: 09/13/2015 Elsevier Patient Education  2020 [employer] Iron-Rich Diet  Iron is a mineral that helps your body to produce hemoglobin. Hemoglobin is a protein in red blood cells that carries oxygen to your body's tissues. Eating too little iron may cause you to feel weak and tired, and it can increase your risk of infection. Iron is naturally found in many foods, and many foods have  iron added to them (iron-fortified foods). You may need to follow an iron-rich diet if you do not have enough iron in your body due to certain medical conditions. The amount of iron that you need each day depends on your age, your sex, and any medical conditions you have. Follow instructions from your health care provider or a diet and nutrition  specialist (dietitian) about how much iron you should eat each day. What are tips for following this plan? Reading food labels  Check food labels to see how many milligrams (mg) of iron are in each serving. Cooking  Cook foods in pots and pans that are made from iron.  Take these steps to make it easier for your body to absorb iron from certain foods: ? Soak beans overnight before cooking. ? Soak whole grains overnight and drain them before using. ? Ferment flours before baking, such as by using yeast in bread dough. Meal planning  When you eat foods that contain iron, you should eat them with foods that are high in vitamin C. These include oranges, peppers, tomatoes, potatoes, and mango. Vitamin C helps your body to absorb iron. General information  Take iron supplements only as told by your health care provider. An overdose of iron can be life-threatening. If you were prescribed iron supplements, take them with orange juice or a vitamin C supplement.  When you eat iron-fortified foods or take an iron supplement, you should also eat foods that naturally contain iron, such as meat, poultry, and fish. Eating naturally iron-rich foods helps your body to absorb the iron that is added to other foods or contained in a supplement.  Certain foods and drinks prevent your body from absorbing iron properly. Avoid eating these foods in the same meal as iron-rich foods or with iron supplements. These foods include: ? Coffee, black tea, and red wine. ? Milk, dairy products, and foods that are high in calcium. ? Beans and soybeans. ? Whole grains. What foods should I eat? Fruits Prunes. Raisins. Eat fruits high in vitamin C, such as oranges, grapefruits, and strawberries, alongside iron-rich foods. Vegetables Spinach (cooked). Green peas. Broccoli. Fermented vegetables. Eat vegetables high in vitamin C, such as leafy greens, potatoes, bell peppers, and tomatoes, alongside iron-rich foods. Grains  Iron-fortified breakfast cereal. Iron-fortified whole-wheat bread. Enriched rice. Sprouted grains. Meats and other proteins Beef liver. Oysters. Beef. Shrimp. [place]. Chicken. Tuna. Sardines. Chickpeas. Nuts. Tofu. Pumpkin seeds. Beverages Tomato juice. Fresh orange juice. Prune juice. Hibiscus tea. Fortified instant breakfast shakes. Sweets and desserts Blackstrap molasses. Seasonings and condiments Tahini. Fermented soy sauce. Other foods Wheat germ. The items listed above may not be a complete list of recommended foods and beverages. Contact a dietitian for more information. What foods should I avoid? Grains Whole grains. Bran cereal. Bran flour. Oats. Meats and other proteins Soybeans. Products made from soy protein. Black beans. Lentils. Mung beans. Split peas. Dairy Milk. Cream. Cheese. Yogurt. Cottage cheese. Beverages Coffee. Black tea. Red wine. Sweets and desserts Cocoa. Chocolate. Ice cream. Other foods Basil. Oregano. Large amounts of parsley. The items listed above may not be a complete list of foods and beverages to avoid. Contact a dietitian for more information. Summary  Iron is a mineral that helps your body to produce hemoglobin. Hemoglobin is a protein in red blood cells that carries oxygen to your body's tissues.  Iron is naturally found in many foods, and many foods have iron added to them (iron-fortified foods).  When you eat foods that contain  iron, you should eat them with foods that are high in vitamin C. Vitamin C helps your body to absorb iron.  Certain foods and drinks prevent your body from absorbing iron properly, such as whole grains and dairy products. You should avoid eating these foods in the same meal as iron-rich foods or with iron supplements. This information is not intended to replace advice given to you by your health care provider. Make sure you discuss any questions you have with your health care provider. Document Released: 03/18/2005  Document Revised: 07/17/2017 Document Reviewed: 06/30/2017 Elsevier Patient Education  2020 [employer]

## 2019-03-18 ENCOUNTER — Encounter: Payer: Self-pay | Admitting: Obstetrics and Gynecology

## 2019-03-18 ENCOUNTER — Other Ambulatory Visit: Payer: Self-pay | Admitting: Obstetrics and Gynecology

## 2019-03-18 DIAGNOSIS — O99013 Anemia complicating pregnancy, third trimester: Secondary | ICD-10-CM | POA: Insufficient documentation

## 2019-03-18 LAB — GLUCOSE TOLERANCE, 2 HOURS W/ 1HR
Glucose, 1 hour: 108 mg/dL (ref 65–179)
Glucose, 2 hour: 102 mg/dL (ref 65–152)
Glucose, Fasting: 87 mg/dL (ref 65–91)

## 2019-03-18 LAB — CBC
Hematocrit: 30.3 % — ABNORMAL LOW (ref 34.0–46.6)
Hemoglobin: 9.7 g/dL — ABNORMAL LOW (ref 11.1–15.9)
MCH: 27.8 pg (ref 26.6–33.0)
MCHC: 32 g/dL (ref 31.5–35.7)
MCV: 87 fL (ref 79–97)
Platelets: 213 10*3/uL (ref 150–450)
RBC: 3.49 x10E6/uL — ABNORMAL LOW (ref 3.77–5.28)
RDW: 12.3 % (ref 11.7–15.4)
WBC: 8.5 10*3/uL (ref 3.4–10.8)

## 2019-03-18 LAB — HIV ANTIBODY (ROUTINE TESTING W REFLEX): HIV Screen 4th Generation wRfx: NONREACTIVE

## 2019-03-18 LAB — RPR: RPR Ser Ql: NONREACTIVE

## 2019-03-18 MED ORDER — FERROUS SULFATE 325 (65 FE) MG PO TBEC: 325.0000 mg | DELAYED_RELEASE_TABLET | Freq: Two times a day (BID) | ORAL | 2 refills | Status: DC

## 2019-03-18 NOTE — Progress Notes (Signed)
Pt notified via My Chart

## 2019-03-19 LAB — CERVICOVAGINAL ANCILLARY ONLY
Bacterial vaginitis: NEGATIVE
Candida vaginitis: POSITIVE — AB
Chlamydia: NEGATIVE
Neisseria Gonorrhea: NEGATIVE
Trichomonas: NEGATIVE

## 2019-03-20 ENCOUNTER — Other Ambulatory Visit: Payer: Self-pay | Admitting: Obstetrics and Gynecology

## 2019-03-20 DIAGNOSIS — N898 Other specified noninflammatory disorders of vagina: Secondary | ICD-10-CM

## 2019-03-20 DIAGNOSIS — O26899 Other specified pregnancy related conditions, unspecified trimester: Secondary | ICD-10-CM

## 2019-03-20 MED ORDER — TERCONAZOLE 0.8 % VA CREA: 1.0000 | TOPICAL_CREAM | Freq: Every day | VAGINAL | 0 refills | Status: DC

## 2019-03-20 NOTE — Progress Notes (Signed)
Tx for yeast

## 2019-04-04 ENCOUNTER — Other Ambulatory Visit: Payer: Self-pay | Admitting: Obstetrics and Gynecology

## 2019-04-04 ENCOUNTER — Other Ambulatory Visit: Payer: Self-pay | Admitting: *Deleted

## 2019-04-04 DIAGNOSIS — K649 Unspecified hemorrhoids: Secondary | ICD-10-CM

## 2019-04-04 MED ORDER — HYDROCORTISONE (PERIANAL) 2.5 % EX CREA: 1.0000 "application " | TOPICAL_CREAM | Freq: Two times a day (BID) | CUTANEOUS | 0 refills | Status: DC

## 2019-04-04 NOTE — Progress Notes (Signed)
Notified from office RN that pt has c/o painful hemorrhoids. Rx sent for hemorrhoids.  Laury Deep, CNM

## 2019-04-06 ENCOUNTER — Other Ambulatory Visit (HOSPITAL_COMMUNITY): Payer: Self-pay | Admitting: *Deleted

## 2019-04-06 ENCOUNTER — Ambulatory Visit (HOSPITAL_COMMUNITY)
Admission: RE | Admit: 2019-04-06 | Discharge: 2019-04-06 | Disposition: A | Discharge status: Home / Self Care | Payer: Medicaid Other | Source: Ambulatory Visit | Attending: Obstetrics and Gynecology | Admitting: Obstetrics and Gynecology

## 2019-04-06 ENCOUNTER — Other Ambulatory Visit: Payer: Self-pay

## 2019-04-06 DIAGNOSIS — Z362 Encounter for other antenatal screening follow-up: Secondary | ICD-10-CM

## 2019-04-06 DIAGNOSIS — O99210 Obesity complicating pregnancy, unspecified trimester: Secondary | ICD-10-CM | POA: Insufficient documentation

## 2019-04-06 DIAGNOSIS — Z3A32 32 weeks gestation of pregnancy: Secondary | ICD-10-CM | POA: Diagnosis not present

## 2019-04-06 DIAGNOSIS — O99212 Obesity complicating pregnancy, second trimester: Secondary | ICD-10-CM

## 2019-04-06 NOTE — Telephone Encounter (Signed)
Telephone call  

## 2019-04-07 ENCOUNTER — Telehealth (INDEPENDENT_AMBULATORY_CARE_PROVIDER_SITE_OTHER): Payer: Medicaid Other | Admitting: Obstetrics and Gynecology

## 2019-04-07 DIAGNOSIS — Z3A32 32 weeks gestation of pregnancy: Secondary | ICD-10-CM

## 2019-04-07 DIAGNOSIS — Z3480 Encounter for supervision of other normal pregnancy, unspecified trimester: Secondary | ICD-10-CM

## 2019-04-07 DIAGNOSIS — O99013 Anemia complicating pregnancy, third trimester: Secondary | ICD-10-CM

## 2019-04-09 ENCOUNTER — Encounter: Payer: Self-pay | Admitting: Obstetrics and Gynecology

## 2019-04-09 NOTE — Progress Notes (Signed)
   MY CHART VIDEO VIRTUAL OBSTETRICS VISIT ENCOUNTER NOTE  I connected with Alicia Craig on 04/09/19 at  4:10 PM EDT by My Chart video at home and verified that I am speaking with the correct person using two identifiers.   I discussed the limitations, risks, security and privacy concerns of performing an evaluation and management service by My Chart video and the availability of in person appointments. I also discussed with the patient that there may be a patient responsible charge related to this service. The patient expressed understanding and agreed to proceed.  Subjective:  Alicia Craig is a 28 y.o. [phone] at [redacted]w[redacted]d being followed for ongoing prenatal care.  She is currently monitored for the following issues for this low-risk pregnancy and has Previous child with Down syndrome, antepartum; Supervision of other normal pregnancy, antepartum; and Anemia affecting pregnancy in third trimester on their problem list.  Patient reports hemorrhoids are still causing pain. She just picked up the Rx hemorrhoid cream.. Reports fetal movement. Denies any contractions, bleeding or leaking of fluid.   The following portions of the patient's history were reviewed and updated as appropriate: allergies, current medications, past family history, past medical history, past social history, past surgical history and problem list.   Objective:   General:  Alert, oriented and cooperative.   Mental Status: Normal mood and affect perceived. Normal judgment and thought content.  Rest of physical exam deferred due to type of encounter  LMP 07/18/2018 (Approximate) Comment: Patient is really unsure of LMP **VS not done for today's visit - she is not at home, but will send via message  Assessment and Plan:  Pregnancy: [id number] at [redacted]w[redacted]d  1. Anemia affecting pregnancy in third trimester - Taking FeSO4 BID - Encouraged to eat an iron-rich diet (info previously provided)  2. Supervision of other normal  pregnancy, antepartum - Anticipatory guidance for GSB and cervix check at nv  Preterm labor symptoms and general obstetric precautions including but not limited to vaginal bleeding, contractions, leaking of fluid and fetal movement were reviewed in detail with the patient.  I discussed the assessment and treatment plan with the patient. The patient was provided an opportunity to ask questions and all were answered. The patient agreed with the plan and demonstrated an understanding of the instructions. The patient was advised to call back or seek an in-person office evaluation/go to MAU at [facility] for any urgent or concerning symptoms. Please refer to After Visit Summary for other counseling recommendations.   I provided 10 minutes of non-face-to-face time during this encounter. There was 5 minutes of chart review time spent prior to this encounter. Total time spent = 15 minutes.  Return in about 4 weeks (around 05/05/2019) for Return OB w/GBS.  Future Appointments  Date Time Provider [place]  05/04/2019 10:15 AM [place] NURSE [place] [facility]  05/04/2019 10:15 AM [place] [place] 4 [facility]  05/05/2019 10:30 AM Laury Deep, CNM [facility] None    Laury Deep, [place] for [employer] [employer]

## 2019-04-28 ENCOUNTER — Other Ambulatory Visit: Payer: Self-pay

## 2019-04-28 ENCOUNTER — Inpatient Hospital Stay (HOSPITAL_COMMUNITY)
Admission: AD | Admit: 2019-04-28 | Discharge: 2019-04-28 | Disposition: A | Discharge status: Home / Self Care | Payer: Medicaid Other | Attending: Obstetrics & Gynecology | Admitting: Obstetrics & Gynecology

## 2019-04-28 ENCOUNTER — Encounter (HOSPITAL_COMMUNITY): Payer: Self-pay

## 2019-04-28 DIAGNOSIS — O479 False labor, unspecified: Secondary | ICD-10-CM

## 2019-04-28 DIAGNOSIS — O4703 False labor before 37 completed weeks of gestation, third trimester: Secondary | ICD-10-CM

## 2019-04-28 DIAGNOSIS — Z3689 Encounter for other specified antenatal screening: Secondary | ICD-10-CM

## 2019-04-28 DIAGNOSIS — O26893 Other specified pregnancy related conditions, third trimester: Secondary | ICD-10-CM | POA: Diagnosis not present

## 2019-04-28 DIAGNOSIS — Z79899 Other long term (current) drug therapy: Secondary | ICD-10-CM | POA: Insufficient documentation

## 2019-04-28 DIAGNOSIS — R1030 Lower abdominal pain, unspecified: Secondary | ICD-10-CM | POA: Diagnosis not present

## 2019-04-28 DIAGNOSIS — Z3A35 35 weeks gestation of pregnancy: Secondary | ICD-10-CM | POA: Diagnosis not present

## 2019-04-28 DIAGNOSIS — O99013 Anemia complicating pregnancy, third trimester: Secondary | ICD-10-CM

## 2019-04-28 DIAGNOSIS — Z3480 Encounter for supervision of other normal pregnancy, unspecified trimester: Secondary | ICD-10-CM

## 2019-04-28 LAB — URINALYSIS, ROUTINE W REFLEX MICROSCOPIC
Bilirubin Urine: NEGATIVE
Glucose, UA: NEGATIVE mg/dL
Hgb urine dipstick: NEGATIVE
Ketones, ur: 20 mg/dL — AB
Leukocytes,Ua: NEGATIVE
Nitrite: NEGATIVE
Protein, ur: NEGATIVE mg/dL
Specific Gravity, Urine: 1.014 (ref 1.005–1.030)
pH: 6 (ref 5.0–8.0)

## 2019-04-28 MED ORDER — LACTATED RINGERS IV BOLUS
1000.0000 mL | Freq: Once | INTRAVENOUS | Status: AC
  Administered 2019-04-28: 1000 mL via INTRAVENOUS

## 2019-04-28 NOTE — Discharge Instructions (Signed)

## 2019-04-28 NOTE — MAU Note (Signed)
Contractions every 10-15 min now.  Contraction/ pelvic pressure since 1 pm yesterday.  No leaking, no bleeding. Baby moving well.

## 2019-04-28 NOTE — MAU Provider Note (Signed)
History     CSN: [id number]  Arrival date and time: 04/28/19 [id number]   First Provider Initiated Contact with Patient 04/28/19 [phone]      Chief Complaint  Patient presents with  . Contractions   HPI Alicia Craig is a 28 y.o. [phone] at 4239w1d who presents to MAU with chief complaint of preterm contractions.  This is a new problem, onset Tuesday 04/26/19 and "staying about the same" since that time. She rates her pain as 5/10. Her lower abdominal discomfort radiates to her pelvis. She denies aggravating or alleviating factors. She has not taken medication or tried other treatments for this complaint.  She denies vaginal bleeding, leaking of fluid, decreased fetal movement, fever, falls, or recent illness.    OB History    Gravida  5   Para  3   Term  3   Preterm      AB  1   Living  3     SAB  1   TAB      Ectopic      Multiple  0   Live Births  3           Past Medical History:  Diagnosis Date  . Chlamydia 2011    Past Surgical History:  Procedure Laterality Date  . DILATION AND CURETTAGE OF UTERUS      Family History  Problem Relation Age of Onset  . Hypertension Mother   . Diabetes Maternal Grandmother   . Down syndrome Daughter     Social History   Tobacco Use  . Smoking status: Never Smoker  . Smokeless tobacco: Never Used  Substance Use Topics  . Alcohol use: No  . Drug use: No    Allergies: No Known Allergies  Medications Prior to Admission  Medication Sig Dispense Refill Last Dose  . ferrous sulfate 325 (65 FE) MG EC tablet Take 1 tablet (325 mg total) by mouth 2 (two) times daily with a meal. 60 tablet 2 04/27/2019 at Unknown time  . Prenat-Fe Carbonyl-FA-Omega 3 (ONE-A-DAY WOMENS PRENATAL 1) 28-0.8-235 MG CAPS Take 1 capsule by mouth daily. 30 capsule 0 04/27/2019 at Unknown time  . Blood Pressure Monitoring (BLOOD PRESSURE MONITOR AUTOMAT) DEVI Appropriate size cuff (forearm circumference) with automatic BP monitor. To be monitored  regularly at home. ICD-10 code: Z34.80, Supervision of other normal pregnancy. 1 Device 0   . Elastic Bandages & Supports (COMFORT FIT MATERNITY SUPP LG) MISC 1 Units by Does not apply route daily. 1 each 0   . fluticasone (FLONASE) 50 MCG/ACT nasal spray Place 1-2 sprays into both nostrils daily for 7 days. 1 g 0   . hydrocortisone (ANUSOL-HC) 2.5 % rectal cream Place 1 application rectally 2 (two) times daily. 30 g 0   . terconazole (TERAZOL 3) 0.8 % vaginal cream Place 1 applicator vaginally at bedtime. 20 g 0     Review of Systems  Constitutional: Negative for chills, fatigue and fever.  Respiratory: Negative for shortness of breath.   Gastrointestinal: Positive for abdominal pain.  Genitourinary: Positive for pelvic pain. Negative for difficulty urinating, dysuria, vaginal bleeding, vaginal discharge and vaginal pain.  Musculoskeletal: Negative for back pain.  All other systems reviewed and are negative.  Physical Exam   Last menstrual period 07/18/2018.  Physical Exam  Nursing note and vitals reviewed. Constitutional: She is oriented to person, place, and time. She appears well-developed and well-nourished.  Cardiovascular: Normal rate.  Respiratory: Effort normal.  GI: Soft.  Gravid  Genitourinary:    Vagina and uterus normal.     No vaginal discharge.   Neurological: She is alert and oriented to person, place, and time.  Skin: Skin is warm.  Psychiatric: She has a normal mood and affect. Her behavior is normal. Judgment and thought content normal.    MAU Course/MDM  Procedures  --Prenatal records reviewed. Hx preterm labor with tern SVD x 3. -Reactive tracing: baseline 130, moderate variability, positive accels, no decels (tracing significant maternal movement at 0618) --Toco: irregular mild contractions q 2-5 minutes, resolving with IV fluid bolus --Cervix remains closed following almost four hour continuous monitoring  Patient Vitals for the past 24 hrs:  BP Temp  Temp src Pulse Resp  04/28/19 0508 117/62 98.1 F (36.7 C) Oral (!) 101 16   Meds ordered this encounter  Medications  . lactated ringers bolus 1,000 mL   Results for orders placed or performed during the hospital encounter of 04/28/19 (from the past 24 hour(s))  Urinalysis, Routine w reflex microscopic     Status: Abnormal   Collection Time: 04/28/19  3:22 AM  Result Value Ref Range   Color, Urine YELLOW YELLOW   APPearance CLEAR CLEAR   Specific Gravity, Urine 1.014 1.005 - 1.030   pH 6.0 5.0 - 8.0   Glucose, UA NEGATIVE NEGATIVE mg/dL   Hgb urine dipstick NEGATIVE NEGATIVE   Bilirubin Urine NEGATIVE NEGATIVE   Ketones, ur 20 (A) NEGATIVE mg/dL   Protein, ur NEGATIVE NEGATIVE mg/dL   Nitrite NEGATIVE NEGATIVE   Leukocytes,Ua NEGATIVE NEGATIVE   Assessment and Plan  --28 y.o. [id number] at [redacted]w[redacted]d  --Reactive tracing, closed cervix --Patient sleeping for final two hours of observation --Discharge home in stable condition with preterm labor precautions  F/U: MFM 09/16, ROB [facility] 09/17  Darlina Rumpf, [place] 04/28/2019, 7:00 AM

## 2019-05-04 ENCOUNTER — Ambulatory Visit (HOSPITAL_COMMUNITY): Payer: Medicaid Other | Admitting: *Deleted

## 2019-05-04 ENCOUNTER — Ambulatory Visit (HOSPITAL_COMMUNITY)
Admission: RE | Admit: 2019-05-04 | Discharge: 2019-05-04 | Disposition: A | Discharge status: Home / Self Care | Payer: Medicaid Other | Source: Ambulatory Visit | Attending: Obstetrics and Gynecology | Admitting: Obstetrics and Gynecology

## 2019-05-04 ENCOUNTER — Encounter (HOSPITAL_COMMUNITY): Payer: Self-pay

## 2019-05-04 ENCOUNTER — Other Ambulatory Visit: Payer: Self-pay

## 2019-05-04 DIAGNOSIS — O99013 Anemia complicating pregnancy, third trimester: Secondary | ICD-10-CM

## 2019-05-04 DIAGNOSIS — Z362 Encounter for other antenatal screening follow-up: Secondary | ICD-10-CM | POA: Diagnosis not present

## 2019-05-04 DIAGNOSIS — Z3480 Encounter for supervision of other normal pregnancy, unspecified trimester: Secondary | ICD-10-CM

## 2019-05-04 DIAGNOSIS — O99210 Obesity complicating pregnancy, unspecified trimester: Secondary | ICD-10-CM | POA: Insufficient documentation

## 2019-05-04 DIAGNOSIS — Z3A36 36 weeks gestation of pregnancy: Secondary | ICD-10-CM

## 2019-05-04 DIAGNOSIS — O99213 Obesity complicating pregnancy, third trimester: Secondary | ICD-10-CM | POA: Diagnosis not present

## 2019-05-04 DIAGNOSIS — O09293 Supervision of pregnancy with other poor reproductive or obstetric history, third trimester: Secondary | ICD-10-CM | POA: Diagnosis not present

## 2019-05-05 ENCOUNTER — Other Ambulatory Visit (HOSPITAL_COMMUNITY)
Admission: RE | Admit: 2019-05-05 | Discharge: 2019-05-05 | Disposition: A | Discharge status: Home / Self Care | Payer: Medicaid Other | Source: Ambulatory Visit | Attending: Obstetrics and Gynecology | Admitting: Obstetrics and Gynecology

## 2019-05-05 ENCOUNTER — Encounter: Payer: Self-pay | Admitting: Obstetrics and Gynecology

## 2019-05-05 ENCOUNTER — Ambulatory Visit (INDEPENDENT_AMBULATORY_CARE_PROVIDER_SITE_OTHER): Payer: Medicaid Other | Admitting: Obstetrics and Gynecology

## 2019-05-05 ENCOUNTER — Other Ambulatory Visit: Payer: Self-pay | Admitting: Obstetrics and Gynecology

## 2019-05-05 DIAGNOSIS — Z3480 Encounter for supervision of other normal pregnancy, unspecified trimester: Secondary | ICD-10-CM | POA: Insufficient documentation

## 2019-05-05 DIAGNOSIS — Z3483 Encounter for supervision of other normal pregnancy, third trimester: Secondary | ICD-10-CM

## 2019-05-05 DIAGNOSIS — Z3A36 36 weeks gestation of pregnancy: Secondary | ICD-10-CM

## 2019-05-05 NOTE — Progress Notes (Signed)
   PRENATAL VISIT NOTE  Subjective:  Alicia Craig is a 28 y.o. [phone] at [redacted]w[redacted]d being seen today for ongoing prenatal care.  She is currently monitored for the following issues for this low-risk pregnancy and has Previous child with Down syndrome, antepartum; Supervision of other normal pregnancy, antepartum; and Anemia affecting pregnancy in third trimester on their problem list.  Patient reports occasional contractions.  Contractions: Irregular. Vag. Bleeding: None.  Movement: Present. Denies leaking of fluid.   The following portions of the patient's history were reviewed and updated as appropriate: allergies, current medications, past family history, past medical history, past social history, past surgical history and problem list.   Objective:   Vitals:   05/05/19 1039  BP: 134/81  Pulse: (!) 102  Temp: 98.1 F (36.7 C)  Weight: 222 lb 3.2 oz (100.8 kg)    Fetal Status: Fetal Heart Rate (bpm): 160 Fundal Height: 39 cm Movement: Present  Presentation: Vertex  General:  Alert, oriented and cooperative. Patient is in no acute distress.  Skin: Skin is warm and dry. No rash noted.   Cardiovascular: Normal heart rate noted  Respiratory: Normal respiratory effort, no problems with respiration noted  Abdomen: Soft, gravid, appropriate for gestational age.  Pain/Pressure: Absent     Pelvic: Cervical exam performed Dilation: Ext Os=1 cm; Int Os=Closed Effacement (%): 50 Station: -3  Extremities: Normal range of motion.  Edema: None  Mental Status: Normal mood and affect. Normal behavior. Normal judgment and thought content.   Assessment and Plan:  Pregnancy: [id number] at [redacted]w[redacted]d 1. Supervision of other normal pregnancy, antepartum - Culture, beta strep (group b only) - Cervicovaginal ancillary only( ) - Discussed last U/S: normal growth, EFW 2904 gm (6 lbs 6 oz)  last baby was NSVD weighing 7 lbs 3 oz - Discussed labor plans -- planning epidural  Preterm labor  symptoms and general obstetric precautions including but not limited to vaginal bleeding, contractions, leaking of fluid and fetal movement were reviewed in detail with the patient. Please refer to After Visit Summary for other counseling recommendations.   Return in about 1 week (around 05/12/2019) for Return OB - My Chart video.  Future Appointments  Date Time Provider [place]  05/13/2019 10:10 AM Alicia Guild, NP [facility] None    Laury Deep, CNM

## 2019-05-05 NOTE — Patient Instructions (Signed)
Braxton Hicks Contractions Contractions of the uterus can occur throughout pregnancy, but they are not always a sign that you are in labor. You may have practice contractions called Braxton Hicks contractions. These false labor contractions are sometimes confused with true labor. What are Braxton Hicks contractions? Braxton Hicks contractions are tightening movements that occur in the muscles of the uterus before labor. Unlike true labor contractions, these contractions do not result in opening (dilation) and thinning of the cervix. Toward the end of pregnancy (32-34 weeks), Braxton Hicks contractions can happen more often and may become stronger. These contractions are sometimes difficult to tell apart from true labor because they can be very uncomfortable. You should not feel embarrassed if you go to the hospital with false labor. Sometimes, the only way to tell if you are in true labor is for your health care provider to look for changes in the cervix. The health care provider will do a physical exam and may monitor your contractions. If you are not in true labor, the exam should show that your cervix is not dilating and your water has not broken. If there are no other health problems associated with your pregnancy, it is completely safe for you to be sent home with false labor. You may continue to have Braxton Hicks contractions until you go into true labor. How to tell the difference between true labor and false labor True labor  Contractions last 30-70 seconds.  Contractions become very regular.  Discomfort is usually felt in the top of the uterus, and it spreads to the lower abdomen and low back.  Contractions do not go away with walking.  Contractions usually become more intense and increase in frequency.  The cervix dilates and gets thinner. False labor  Contractions are usually shorter and not as strong as true labor contractions.  Contractions are usually irregular.  Contractions  are often felt in the front of the lower abdomen and in the groin.  Contractions may go away when you walk around or change positions while lying down.  Contractions get weaker and are shorter-lasting as time goes on.  The cervix usually does not dilate or become thin. Follow these instructions at home:   Take over-the-counter and prescription medicines only as told by your health care provider.  Keep up with your usual exercises and follow other instructions from your health care provider.  Eat and drink lightly if you think you are going into labor.  If Braxton Hicks contractions are making you uncomfortable: ? Change your position from lying down or resting to walking, or change from walking to resting. ? Sit and rest in a tub of warm water. ? Drink enough fluid to keep your urine pale yellow. Dehydration may cause these contractions. ? Do slow and deep breathing several times an hour.  Keep all follow-up prenatal visits as told by your health care provider. This is important. Contact a health care provider if:  You have a fever.  You have continuous pain in your abdomen. Get help right away if:  Your contractions become stronger, more regular, and closer together.  You have fluid leaking or gushing from your vagina.  You pass blood-tinged mucus (bloody show).  You have bleeding from your vagina.  You have low back pain that you never had before.  You feel your baby's head pushing down and causing pelvic pressure.  Your baby is not moving inside you as much as it used to. Summary  Contractions that occur before labor are   called Braxton Hicks contractions, false labor, or practice contractions.  Braxton Hicks contractions are usually shorter, weaker, farther apart, and less regular than true labor contractions. True labor contractions usually become progressively stronger and regular, and they become more frequent.  Manage discomfort from Braxton Hicks contractions  by changing position, resting in a warm bath, drinking plenty of water, or practicing deep breathing. This information is not intended to replace advice given to you by your health care provider. Make sure you discuss any questions you have with your health care provider. Document Released: 12/18/2016 Document Revised: 07/17/2017 Document Reviewed: 12/18/2016 Elsevier Patient Education  2020 [employer]  

## 2019-05-08 LAB — CULTURE, BETA STREP (GROUP B ONLY): Strep Gp B Culture: POSITIVE — AB

## 2019-05-09 DIAGNOSIS — N898 Other specified noninflammatory disorders of vagina: Secondary | ICD-10-CM

## 2019-05-09 LAB — CERVICOVAGINAL ANCILLARY ONLY
Bacterial Vaginitis (gardnerella): NEGATIVE
Candida Glabrata: NEGATIVE
Candida Vaginitis: POSITIVE — AB
Molecular Disclaimer: NEGATIVE
Molecular Disclaimer: NEGATIVE
Molecular Disclaimer: NEGATIVE
Molecular Disclaimer: NORMAL
Trichomonas: NEGATIVE

## 2019-05-09 MED ORDER — TERCONAZOLE 0.8 % VA CREA: 1.0000 | TOPICAL_CREAM | Freq: Every day | VAGINAL | 0 refills | Status: DC

## 2019-05-09 NOTE — Telephone Encounter (Signed)
-----   Message from Laury Deep, [place] sent at 05/09/2019  3:41 PM EDT ----- Please treat for yeast

## 2019-05-10 LAB — CERVICOVAGINAL ANCILLARY ONLY
Chlamydia: NEGATIVE
Neisseria Gonorrhea: POSITIVE — AB

## 2019-05-12 NOTE — Telephone Encounter (Signed)
-----   Message from Laury Deep, [place] sent at 05/12/2019 10:25 AM EDT ----- She needs treatment for gonorrhea. I sent her a [facility] message

## 2019-05-12 NOTE — Telephone Encounter (Signed)
Tried calling patient this AM regarding vaginal culture results. Unable to leave a voice message, mailbox full.  Derl Barrow, RN

## 2019-05-13 ENCOUNTER — Other Ambulatory Visit: Payer: Self-pay

## 2019-05-13 ENCOUNTER — Telehealth (INDEPENDENT_AMBULATORY_CARE_PROVIDER_SITE_OTHER): Payer: Medicaid Other | Admitting: Student

## 2019-05-13 VITALS — BP 117/75 | HR 93 | Temp 98.8°F | Wt 221.6 lb

## 2019-05-13 DIAGNOSIS — Z3480 Encounter for supervision of other normal pregnancy, unspecified trimester: Secondary | ICD-10-CM

## 2019-05-13 DIAGNOSIS — B951 Streptococcus, group B, as the cause of diseases classified elsewhere: Secondary | ICD-10-CM | POA: Insufficient documentation

## 2019-05-13 DIAGNOSIS — Z3A37 37 weeks gestation of pregnancy: Secondary | ICD-10-CM

## 2019-05-13 DIAGNOSIS — O98213 Gonorrhea complicating pregnancy, third trimester: Secondary | ICD-10-CM | POA: Diagnosis not present

## 2019-05-13 MED ORDER — CEFTRIAXONE SODIUM 250 MG IJ SOLR
250.0000 mg | Freq: Once | INTRAMUSCULAR | Status: AC
  Administered 2019-05-13: 12:00:00 250 mg via INTRAMUSCULAR

## 2019-05-13 MED ORDER — AZITHROMYCIN 500 MG PO TABS
1000.0000 mg | ORAL_TABLET | Freq: Once | ORAL | Status: AC
  Administered 2019-05-13: 12:00:00 1000 mg via ORAL

## 2019-05-13 NOTE — Patient Instructions (Signed)
Gonorrhea Gonorrhea is a sexually transmitted disease (STD) that can affect both men and women. If left untreated, this infection can:  Damage the female or female organs.  Cause women and men to be unable to have children (be sterile).  Harm a fetus if an infected woman is pregnant. It is important to get treatment for gonorrhea as soon as possible. It is also necessary for all of your sexual partners to be tested for the infection. What are the causes? This condition is caused by bacteria called Neisseria gonorrhoeae. The infection is spread from person to person through sexual contact, including oral, anal, and vaginal sex. A newborn can contract the infection from his or her mother during birth. What increases the risk? The following factors may make you more likely to develop this condition:  Being a woman who is younger than 30 years of age and who is sexually active.  Being a woman 25 years of age or older who has: ? A new sex partner. ? More than one sex partner. ? A sex partner who has an STD.  Being a man who has: ? A new sex partner. ? More than one sex partner. ? A sex partner who has an STD.  Using condoms inconsistently.  Currently having, or having previously had, an STD.  Exchanging sex for money or drugs. What are the signs or symptoms? Some people do not have any symptoms. If you do have symptoms, they may be different for females and males. For females  Pain in the lower abdomen.  Abnormal vaginal discharge. The discharge may be cloudy, thick, or yellow-green in color.  Bleeding between periods.  Painful sex.  Burning or itching in and around the vagina.  Pain or burning when urinating.  Irritation, pain, bleeding, or discharge from the rectum. This may occur if the infection was spread by anal sex.  Sore throat or swollen lymph nodes in the neck. This may occur if the infection was spread by oral sex. For males  Abnormal discharge from the penis.  This discharge may be cloudy, thick, or yellow-green in color.  Pain or burning during urination.  Pain or swelling in the testicles.  Irritation, pain, bleeding, or discharge from the rectum. This may occur if the infection was spread by anal sex.  Sore throat, fever, or swollen lymph nodes in the neck. This may occur if the infection was spread by oral sex. How is this diagnosed? This condition is diagnosed based on:  A physical exam.  A sample of discharge that is examined under a microscope to look for the bacteria. The discharge may be taken from the urethra, cervix, throat, or rectum.  Urine tests. Not all of test results will be available during your visit. How is this treated? This condition is treated with antibiotic medicines. It is important for treatment to begin as soon as possible. Early treatment may prevent some problems from developing. Do not have sex during treatment. Avoid all types of sexual activity for 7 days after treatment is complete and until any sex partners have been treated. Follow these instructions at home:  Take over-the-counter and prescription medicines only as told by your health care provider.  Take your antibiotic medicine as told by your health care provider. Do not stop taking the antibiotic even if you start to feel better.  Do not have sex until at least 7 days after you and your partner(s) have finished treatment and your health care provider says it is okay.    It is your responsibility to get your test results. Ask your health care provider, or the department performing the test, when your results will be ready.  If you test positive for gonorrhea, inform your recent sexual partners. This includes any oral, anal, or vaginal sex partners. They need to be checked for gonorrhea even if they do not have symptoms. They may need treatment, even if they test negative for gonorrhea.  Keep all follow-up visits as told by your health care provider.  This is important. How is this prevented?   Use latex condoms correctly every time you have sexual intercourse.  Ask if your sexual partner has been tested for STDs and had negative results.  Avoid having multiple sexual partners. Contact a health care provider if:  You develop a bad reaction to the medicine you were prescribed. This may include: ? A rash. ? Nausea. ? Vomiting. ? Diarrhea.  Your symptoms do not get better after a few days of taking antibiotics.  Your symptoms get worse.  You develop new symptoms.  Your pain gets worse.  You have a fever.  You develop pain, itching, or discharge around the eyes. Get help right away if:  You feel dizzy or faint.  You have trouble breathing or have shortness of breath.  You develop an irregular heartbeat.  You have severe abdominal pain with or without shoulder pain.  You develop any bumps or sores (lesions) on your skin.  You develop warmth, redness, pain, or swelling around your joints, such as the knee. Summary  Gonorrhea is an STD that can affect both men and women.  This condition is caused by bacteria called Neisseria gonorrhoeae. The infection is spread from person to person, usually through sexual contact, including oral, anal, and vaginal sex.  Symptoms vary between males and females. Generally, they include abnormal discharge and burning during urination. Women may also experience painful sex, itching around the vagina, and bleeding between menstrual periods. Men may also experience swelling of the testicles.  This condition is treated with antibiotic medicines. Do not have sex until at least 7 days after completing antibiotic treatment.  If left untreated, gonorrhea can have serious side effects and complications. This information is not intended to replace advice given to you by your health care provider. Make sure you discuss any questions you have with your health care provider. Document Released:  08/01/2000 Document Revised: 09/10/2018 Document Reviewed: 07/04/2016 Elsevier Patient Education  2020 [employer]    Group B Streptococcus Test During Pregnancy Why am I having this test? Routine testing, also called screening, for group B streptococcus (GBS) is recommended for all pregnant women between the 36th and 37th week of pregnancy. GBS is a type of bacteria that can be passed from mother to baby during childbirth. Screening will help guide whether or not you will need treatment during labor and delivery to prevent complications such as:  An infection in your uterus during labor.  An infection in your uterus after delivery.  A serious infection in your baby after delivery, such as pneumonia, meningitis, or sepsis. GBS screening is not often done before 36 weeks of pregnancy unless you go into labor prematurely. What happens if I have group B streptococcus? If testing shows that you have GBS, your health care provider will recommend treatment with IV antibiotics during labor and delivery. This treatment significantly decreases the risk of complications for you and your baby. If you have a planned C-section and you have GBS, you may not  need to be treated with antibiotics because GBS is usually passed to babies after labor starts and your water breaks. If you are in labor or your water breaks before your C-section, it is possible for GBS to get into your uterus and be passed to your baby, so you might need treatment. Is there a chance I may not need to be tested? You may not need to be tested for GBS if:  You have a urine test that shows GBS before 36 to 37 weeks.  You had a baby with GBS infection after a previous delivery. In these cases, you will automatically be treated for GBS during labor and delivery. What is being tested? This test is done to check if you have group B streptococcus in your vagina or rectum. What kind of sample is taken? To collect samples for this test,  your health care provider will swab your vagina and rectum with a cotton swab. The sample is then sent to the lab to see if GBS is present. What happens during the test?   You will remove your clothing from the waist down.  You will lie down on an exam table in the same position as you would for a pelvic exam.  Your health care provider will swab your vagina and rectum to collect samples for a culture test.  You will be able to go home after the test and do all your usual activities. How are the results reported? The test results are reported as positive or negative. What do the results mean?  A positive test means you are at risk for passing GBS to your baby during labor and delivery. Your health care provider will recommend that you are treated with an IV antibiotic during labor and delivery.  A negative test means you are at very low risk of passing GBS to your baby. There is still a low risk of passing GBS to your baby because sometimes test results may report that you do not have a condition when you do (false-negative result) or there is a chance that you may become infected with GBS after the test is done. You most likely will not need to be treated with an antibiotic during labor and delivery. Talk with your health care provider about what your results mean. Questions to ask your health care provider Ask your health care provider, or the department that is doing the test:  When will my results be ready?  How will I get my results?  What are my treatment options? Summary  Routine testing (screening) for group B streptococcus (GBS) is recommended for all pregnant women between the 36th and 37th week of pregnancy.  GBS is a type of bacteria that can be passed from mother to baby during childbirth.  If testing shows that you have GBS, your health care provider will recommend that you are treated with IV antibiotics during labor and delivery. This treatment almost always prevents  infection in newborns. This information is not intended to replace advice given to you by your health care provider. Make sure you discuss any questions you have with your health care provider. Document Released: 09/01/2018 Document Revised: 11/25/2018 Document Reviewed: 09/01/2018 Elsevier Patient Education  2020 [employer].

## 2019-05-13 NOTE — Progress Notes (Signed)
   PRENATAL VISIT NOTE  Subjective:  Alicia Craig is a 28 y.o. [phone] at [redacted]w[redacted]d being seen today for ongoing prenatal care.  She is currently monitored for the following issues for this low-risk pregnancy and has Previous child with Down syndrome, antepartum; Supervision of other normal pregnancy, antepartum; Anemia affecting pregnancy in third trimester; Positive GBS test; and Gonorrhea affecting pregnancy in third trimester on their problem list.  Patient reports no complaints.  Contractions: Irregular. Vag. Bleeding: None.  Movement: Present. Denies leaking of fluid.   The following portions of the patient's history were reviewed and updated as appropriate: allergies, current medications, past family history, past medical history, past social history, past surgical history and problem list.   Objective:   Vitals:   05/13/19 1135  BP: 117/75  Pulse: 93  Temp: 98.8 F (37.1 C)  Weight: 221 lb 9.6 oz (100.5 kg)    Fetal Status: Fetal Heart Rate (bpm): 144 Fundal Height: 39 cm Movement: Present     General:  Alert, oriented and cooperative. Patient is in no acute distress.  Skin: Skin is warm and dry. No rash noted.   Cardiovascular: Normal heart rate noted  Respiratory: Normal respiratory effort, no problems with respiration noted  Abdomen: Soft, gravid, appropriate for gestational age.  Pain/Pressure: Present     Pelvic: Cervical exam deferred        Extremities: Normal range of motion.  Edema: None  Mental Status: Normal mood and affect. Normal behavior. Normal judgment and thought content.   Assessment and Plan:  Pregnancy: [id number] at [redacted]w[redacted]d 1. Supervision of other normal pregnancy, antepartum -discussed GBS results with patient  2. Gonorrhea affecting pregnancy in third trimester -partner being treated today. No intercourse x 1 week - azithromycin (ZITHROMAX) tablet 1,000 mg - cefTRIAXone (ROCEPHIN) injection 250 mg  Term labor symptoms and general obstetric  precautions including but not limited to vaginal bleeding, contractions, leaking of fluid and fetal movement were reviewed in detail with the patient. Please refer to After Visit Summary for other counseling recommendations.   Return in about 1 week (around 05/20/2019) for virtual routine ob.  Future Appointments  Date Time Provider [place]  05/26/2019  2:30 PM Laury Deep, CNM [facility] None    Jorje Guild, NP

## 2019-05-13 NOTE — Progress Notes (Signed)
     S: Patient in clinic today for STD treatment of Gonorrhea.    O: Need for treatment of Gonorrhea.  A: Azithromycin 1 GM PO x 1 given and Ceftriaxone 250 mg IM x 1 given in RUOQ per  Junie Panning Lawerence's, NP orders.    Patient observed 15 minutes in office.  No reaction noted.   P: Patient to follow up in 1 months for re-screening.    STD report form fax completed and faxed to [facility] Department at [phone] (STD department).     Patient advised to abstain from sex for 7-10 days after treatment or when partner has been tested/treated.    Derl Barrow, RN

## 2019-05-18 ENCOUNTER — Encounter: Payer: Self-pay | Admitting: General Practice

## 2019-05-26 ENCOUNTER — Inpatient Hospital Stay (HOSPITAL_COMMUNITY): Payer: Medicaid Other | Admitting: Anesthesiology

## 2019-05-26 ENCOUNTER — Other Ambulatory Visit: Payer: Self-pay

## 2019-05-26 ENCOUNTER — Encounter: Payer: Medicaid Other | Admitting: Obstetrics and Gynecology

## 2019-05-26 ENCOUNTER — Encounter (HOSPITAL_COMMUNITY): Payer: Self-pay | Admitting: *Deleted

## 2019-05-26 ENCOUNTER — Inpatient Hospital Stay (HOSPITAL_COMMUNITY)
Admission: RE | Admit: 2019-05-26 | Discharge: 2019-05-28 | DRG: 807 | Disposition: A | Discharge status: Home / Self Care | Payer: Medicaid Other | Attending: Family Medicine | Admitting: Family Medicine

## 2019-05-26 DIAGNOSIS — D649 Anemia, unspecified: Secondary | ICD-10-CM | POA: Diagnosis present

## 2019-05-26 DIAGNOSIS — O98213 Gonorrhea complicating pregnancy, third trimester: Secondary | ICD-10-CM | POA: Diagnosis present

## 2019-05-26 DIAGNOSIS — O9902 Anemia complicating childbirth: Secondary | ICD-10-CM | POA: Diagnosis present

## 2019-05-26 DIAGNOSIS — Z30017 Encounter for initial prescription of implantable subdermal contraceptive: Secondary | ICD-10-CM

## 2019-05-26 DIAGNOSIS — O99824 Streptococcus B carrier state complicating childbirth: Secondary | ICD-10-CM

## 2019-05-26 DIAGNOSIS — O99214 Obesity complicating childbirth: Secondary | ICD-10-CM | POA: Diagnosis present

## 2019-05-26 DIAGNOSIS — Z20828 Contact with and (suspected) exposure to other viral communicable diseases: Secondary | ICD-10-CM | POA: Diagnosis present

## 2019-05-26 DIAGNOSIS — B951 Streptococcus, group B, as the cause of diseases classified elsewhere: Secondary | ICD-10-CM | POA: Diagnosis present

## 2019-05-26 DIAGNOSIS — Z3A39 39 weeks gestation of pregnancy: Secondary | ICD-10-CM

## 2019-05-26 DIAGNOSIS — O26893 Other specified pregnancy related conditions, third trimester: Secondary | ICD-10-CM | POA: Diagnosis present

## 2019-05-26 LAB — CBC
HCT: 35.9 % — ABNORMAL LOW (ref 36.0–46.0)
Hemoglobin: 11.4 g/dL — ABNORMAL LOW (ref 12.0–15.0)
MCH: 28.3 pg (ref 26.0–34.0)
MCHC: 31.8 g/dL (ref 30.0–36.0)
MCV: 89.1 fL (ref 80.0–100.0)
Platelets: 256 10*3/uL (ref 150–400)
RBC: 4.03 MIL/uL (ref 3.87–5.11)
RDW: 12.7 % (ref 11.5–15.5)
WBC: 10.4 10*3/uL (ref 4.0–10.5)
nRBC: 0 % (ref 0.0–0.2)

## 2019-05-26 LAB — SARS CORONAVIRUS 2 BY RT PCR (HOSPITAL ORDER, PERFORMED IN ~~LOC~~ HOSPITAL LAB): SARS Coronavirus 2: NEGATIVE

## 2019-05-26 LAB — TYPE AND SCREEN
ABO/RH(D): A POS
Antibody Screen: NEGATIVE

## 2019-05-26 LAB — ABO/RH: ABO/RH(D): A POS

## 2019-05-26 LAB — RPR: RPR Ser Ql: NONREACTIVE

## 2019-05-26 MED ORDER — LACTATED RINGERS IV SOLN: INTRAVENOUS | Status: DC

## 2019-05-26 MED ORDER — ZOLPIDEM TARTRATE 5 MG PO TABS: 5.0000 mg | ORAL_TABLET | Freq: Every evening | ORAL | Status: DC | PRN

## 2019-05-26 MED ORDER — BENZOCAINE-MENTHOL 20-0.5 % EX AERO
1.0000 "application " | INHALATION_SPRAY | CUTANEOUS | Status: DC | PRN
  Administered 2019-05-26: 1 via TOPICAL
  Filled 2019-05-26: qty 56

## 2019-05-26 MED ORDER — FENTANYL-BUPIVACAINE-NACL 0.5-0.125-0.9 MG/250ML-% EP SOLN: 12.0000 mL/h | EPIDURAL | Status: DC | PRN

## 2019-05-26 MED ORDER — OXYCODONE-ACETAMINOPHEN 5-325 MG PO TABS: 1.0000 | ORAL_TABLET | ORAL | Status: DC | PRN

## 2019-05-26 MED ORDER — FENTANYL-BUPIVACAINE-NACL 0.5-0.125-0.9 MG/250ML-% EP SOLN
EPIDURAL | Status: AC
  Filled 2019-05-26: qty 250

## 2019-05-26 MED ORDER — FERROUS SULFATE 325 (65 FE) MG PO TABS
325.0000 mg | ORAL_TABLET | Freq: Two times a day (BID) | ORAL | Status: DC
  Administered 2019-05-26 – 2019-05-27 (×3): 325 mg via ORAL
  Filled 2019-05-26 (×3): qty 1

## 2019-05-26 MED ORDER — DIPHENHYDRAMINE HCL 25 MG PO CAPS: 25.0000 mg | ORAL_CAPSULE | Freq: Four times a day (QID) | ORAL | Status: DC | PRN

## 2019-05-26 MED ORDER — SOD CITRATE-CITRIC ACID 500-334 MG/5ML PO SOLN: 30.0000 mL | ORAL | Status: DC | PRN

## 2019-05-26 MED ORDER — SODIUM CHLORIDE 0.9 % IV SOLN
5.0000 10*6.[IU] | Freq: Once | INTRAVENOUS | Status: AC
  Administered 2019-05-26: 06:00:00 5 10*6.[IU] via INTRAVENOUS
  Filled 2019-05-26 (×2): qty 5

## 2019-05-26 MED ORDER — ACETAMINOPHEN 325 MG PO TABS
650.0000 mg | ORAL_TABLET | ORAL | Status: DC | PRN
  Administered 2019-05-26 – 2019-05-28 (×2): 650 mg via ORAL
  Filled 2019-05-26 (×3): qty 2

## 2019-05-26 MED ORDER — LIDOCAINE HCL (PF) 1 % IJ SOLN: 30.0000 mL | INTRAMUSCULAR | Status: DC | PRN

## 2019-05-26 MED ORDER — TETANUS-DIPHTH-ACELL PERTUSSIS 5-2.5-18.5 LF-MCG/0.5 IM SUSP: 0.5000 mL | Freq: Once | INTRAMUSCULAR | Status: DC

## 2019-05-26 MED ORDER — FENTANYL CITRATE (PF) 100 MCG/2ML IJ SOLN: 50.0000 ug | INTRAMUSCULAR | Status: DC | PRN

## 2019-05-26 MED ORDER — ONDANSETRON HCL 4 MG PO TABS: 4.0000 mg | ORAL_TABLET | ORAL | Status: DC | PRN

## 2019-05-26 MED ORDER — EPHEDRINE 5 MG/ML INJ: 10.0000 mg | INTRAVENOUS | Status: DC | PRN

## 2019-05-26 MED ORDER — PRENATAL MULTIVITAMIN CH
1.0000 | ORAL_TABLET | Freq: Every day | ORAL | Status: DC
  Administered 2019-05-26 – 2019-05-27 (×2): 1 via ORAL
  Filled 2019-05-26 (×2): qty 1

## 2019-05-26 MED ORDER — PENICILLIN G 3 MILLION UNITS IVPB - SIMPLE MED: 3.0000 10*6.[IU] | INTRAVENOUS | Status: DC

## 2019-05-26 MED ORDER — WITCH HAZEL-GLYCERIN EX PADS: 1.0000 "application " | MEDICATED_PAD | CUTANEOUS | Status: DC | PRN

## 2019-05-26 MED ORDER — LACTATED RINGERS IV SOLN: 500.0000 mL | INTRAVENOUS | Status: DC | PRN

## 2019-05-26 MED ORDER — LIDOCAINE HCL (PF) 1 % IJ SOLN
INTRAMUSCULAR | Status: DC | PRN
  Administered 2019-05-26: 10 mL via EPIDURAL

## 2019-05-26 MED ORDER — DIBUCAINE (PERIANAL) 1 % EX OINT: 1.0000 "application " | TOPICAL_OINTMENT | CUTANEOUS | Status: DC | PRN

## 2019-05-26 MED ORDER — OXYCODONE-ACETAMINOPHEN 5-325 MG PO TABS
1.0000 | ORAL_TABLET | ORAL | Status: DC | PRN
  Administered 2019-05-26 – 2019-05-27 (×2): 1 via ORAL
  Filled 2019-05-26 (×2): qty 1

## 2019-05-26 MED ORDER — IBUPROFEN 600 MG PO TABS
600.0000 mg | ORAL_TABLET | Freq: Four times a day (QID) | ORAL | Status: DC
  Administered 2019-05-26 – 2019-05-28 (×8): 600 mg via ORAL
  Filled 2019-05-26 (×8): qty 1

## 2019-05-26 MED ORDER — FLEET ENEMA 7-19 GM/118ML RE ENEM: 1.0000 | ENEMA | RECTAL | Status: DC | PRN

## 2019-05-26 MED ORDER — PHENYLEPHRINE 40 MCG/ML (10ML) SYRINGE FOR IV PUSH (FOR BLOOD PRESSURE SUPPORT): 80.0000 ug | PREFILLED_SYRINGE | INTRAVENOUS | Status: DC | PRN

## 2019-05-26 MED ORDER — OXYCODONE-ACETAMINOPHEN 5-325 MG PO TABS: 2.0000 | ORAL_TABLET | ORAL | Status: DC | PRN

## 2019-05-26 MED ORDER — LACTATED RINGERS IV SOLN
500.0000 mL | Freq: Once | INTRAVENOUS | Status: AC
  Administered 2019-05-26: 07:00:00 500 mL via INTRAVENOUS

## 2019-05-26 MED ORDER — ONDANSETRON HCL 4 MG/2ML IJ SOLN: 4.0000 mg | Freq: Four times a day (QID) | INTRAMUSCULAR | Status: DC | PRN

## 2019-05-26 MED ORDER — ONDANSETRON HCL 4 MG/2ML IJ SOLN: 4.0000 mg | INTRAMUSCULAR | Status: DC | PRN

## 2019-05-26 MED ORDER — SENNOSIDES-DOCUSATE SODIUM 8.6-50 MG PO TABS
2.0000 | ORAL_TABLET | ORAL | Status: DC
  Administered 2019-05-27 – 2019-05-28 (×2): 2 via ORAL
  Filled 2019-05-26 (×2): qty 2

## 2019-05-26 MED ORDER — SODIUM CHLORIDE (PF) 0.9 % IJ SOLN
INTRAMUSCULAR | Status: DC | PRN
  Administered 2019-05-26: 12 mL/h via EPIDURAL

## 2019-05-26 MED ORDER — OXYTOCIN 40 UNITS IN NORMAL SALINE INFUSION - SIMPLE MED
2.5000 [IU]/h | INTRAVENOUS | Status: DC
  Filled 2019-05-26: qty 1000

## 2019-05-26 MED ORDER — SIMETHICONE 80 MG PO CHEW: 80.0000 mg | CHEWABLE_TABLET | ORAL | Status: DC | PRN

## 2019-05-26 MED ORDER — COCONUT OIL OIL: 1.0000 "application " | TOPICAL_OIL | Status: DC | PRN

## 2019-05-26 MED ORDER — ACETAMINOPHEN 325 MG PO TABS: 650.0000 mg | ORAL_TABLET | ORAL | Status: DC | PRN

## 2019-05-26 MED ORDER — DIPHENHYDRAMINE HCL 50 MG/ML IJ SOLN: 12.5000 mg | INTRAMUSCULAR | Status: DC | PRN

## 2019-05-26 MED ORDER — OXYTOCIN BOLUS FROM INFUSION
500.0000 mL | Freq: Once | INTRAVENOUS | Status: AC
  Administered 2019-05-26: 08:00:00 500 mL via INTRAVENOUS

## 2019-05-26 NOTE — Anesthesia Postprocedure Evaluation (Signed)
Anesthesia Post Note  Patient: Alicia Craig  Procedure(s) Performed: AN AD HOC LABOR EPIDURAL     Patient location during evaluation: Mother Baby Anesthesia Type: Epidural Level of consciousness: awake and alert Pain management: pain level controlled Vital Signs Assessment: post-procedure vital signs reviewed and stable Respiratory status: spontaneous breathing, nonlabored ventilation and respiratory function stable Cardiovascular status: stable Postop Assessment: no headache, no backache and epidural receding Anesthetic complications: no    Last Vitals:  Vitals:   05/26/19 1030 05/26/19 1130  BP: 110/72 116/63  Pulse: 85 69  Resp: 16 18  Temp: 37 C 37 C  SpO2:      Last Pain:  Vitals:   05/26/19 1217  TempSrc:   PainSc: 3    Pain Goal: Patients Stated Pain Goal: 0 (05/26/19 0342)                 Barkley Boards

## 2019-05-26 NOTE — MAU Note (Signed)
Pt reports to MAU c/o ctx every few min. No bleeding or LOF. +FM.  

## 2019-05-26 NOTE — Discharge Summary (Addendum)
Postpartum Discharge Summary     Patient Name: Alicia Craig DOB: 1990-12-02 MRN: [id number]  Date of admission: 05/26/2019 Delivering Provider: Gifford Shave   Date of discharge: 05/28/2019  Admitting diagnosis: 39WKS CTX Intrauterine pregnancy: [redacted]w[redacted]d    Secondary diagnosis:  Active Problems:   Positive GBS test   Gonorrhea affecting pregnancy in third trimester   Indication for care in labor or delivery   Vaginal delivery  Additional problems: none     Discharge diagnosis: Term Pregnancy Delivered                                                                                                Post partum procedures:Nexplanon placed on PPD#1  Augmentation: None  Complications: None  Hospital course:  Onset of Labor With Vaginal Delivery     28y.o. yo [id number] 327w1das admitted in Active Labor on 05/26/2019. Patient had an uncomplicated, rapid labor course. She received a single dose of PCN prior to delivery. She was tx for gonorrhea on 04/2023/10/25information passed on to peds.  Membrane Rupture Time/Date: 7:47 AM ,05/26/2019   Intrapartum Procedures: Episiotomy: None [1]                                         Lacerations:  None [1]  Patient had a delivery of a Viable infant. 05/26/2019  Information for the patient's newborn:  HaScotlynn, Noyesirl [id number][[id number]]Delivery Method: Vag-Spont     Pateint had an uncomplicated postpartum course including a Nexplanon placement on PPD#1. She is ambulating, tolerating a regular diet, passing flatus, and urinating well. Patient is discharged home in stable condition on 05/28/19.  Delivery time: 8:19 AM    Magnesium Sulfate received: No BMZ received: No Rhophylac:N/A MMR:N/A Transfusion:No  Physical exam  Vitals:   05/26/19 2006 05/27/19 0631 05/27/19 1400 05/28/19 0550  BP: 125/83 112/78 101/79 123/64  Pulse: 83 79 78 72  Resp:  '18 18 16  '$ Temp: (!) 97.5 F (36.4 C) 98.4 F (36.9 C) 99 F (37.2 C) 98.9 F  (37.2 C)  TempSrc: Oral  Oral Oral  SpO2: 97% 97%  99%  Weight:      Height:       General: alert and cooperative  L arm: recent Nexplanon placement- healing well Lochia: appropriate Uterine Fundus: firm Incision: N/A DVT Evaluation: No evidence of DVT seen on physical exam. Labs: Lab Results  Component Value Date   WBC 10.4 05/26/2019   HGB 11.4 (L) 05/26/2019   HCT 35.9 (L) 05/26/2019   MCV 89.1 05/26/2019   PLT 256 05/26/2019   No flowsheet data found.  Discharge instruction: per After Visit Summary and "Baby and Me Booklet".  After visit meds:  Allergies as of 05/28/2019   No Known Allergies     Medication List    STOP taking these medications   Blood Pressure Monitor Automat DeVenturaupp Lg Misc   fluticasone 50 MCG/ACT nasal spray Commonly  known as: FLONASE   hydrocortisone 2.5 % rectal cream Commonly known as: Anusol-HC   terconazole 0.8 % vaginal cream Commonly known as: Terazol 3     TAKE these medications   ferrous sulfate 325 (65 FE) MG EC tablet Take 1 tablet (325 mg total) by mouth 2 (two) times daily with a meal.   ibuprofen 600 MG tablet Commonly known as: ADVIL Take 1 tablet (600 mg total) by mouth every 6 (six) hours as needed.   One-A-Day Womens Prenatal 1 28-0.8-235 MG Caps Take 1 capsule by mouth daily.       Diet: routine diet  Activity: Advance as tolerated. Pelvic rest for 6 weeks.   Outpatient follow up:4 weeks- consider gonorrhea TOC Follow up Appt: Future Appointments  Date Time Provider [place]  06/23/2019  3:30 PM Laury Deep, CNM [facility] None   Follow up Visit:   Please schedule this patient for Postpartum visit in: 4 weeks with the following provider: Any provider. Virtual For C/S patients schedule nurse incision check in weeks 2 weeks: no Low risk pregnancy complicated by: None Delivery mode:  SVD Anticipated Birth Control:  Nexplanon IP PP Procedures needed: None   Schedule Integrated [place] visit: no  Newborn Data: Live born female  Birth Weight: 6 lb 10.2 oz (3011 g) APGAR: 9, 9  Newborn Delivery   Birth date/time: 05/26/2019 08:19:00 Delivery type: Vaginal, Spontaneous      Baby Feeding: Bottle and Breast Disposition:home with mother   05/28/2019 Myrtis Ser, CNM  8:36 AM

## 2019-05-26 NOTE — Anesthesia Preprocedure Evaluation (Signed)
Anesthesia Evaluation  Patient identified by MRN, date of birth, ID band Patient awake    Reviewed: Allergy & Precautions, H&P , NPO status , Patient's Chart, lab work & pertinent test results  History of Anesthesia Complications Negative for: history of anesthetic complications  Airway Mallampati: II  TM Distance: >3 FB Neck ROM: full    Dental no notable dental hx.    Pulmonary neg pulmonary ROS,    Pulmonary exam normal        Cardiovascular negative cardio ROS Normal cardiovascular exam Rhythm:regular Rate:Normal     Neuro/Psych negative neurological ROS  negative psych ROS   GI/Hepatic negative GI ROS, Neg liver ROS,   Endo/Other  Morbid obesity  Renal/GU negative Renal ROS  negative genitourinary   Musculoskeletal   Abdominal   Peds  Hematology negative hematology ROS (+)   Anesthesia Other Findings   Reproductive/Obstetrics (+) Pregnancy                             Anesthesia Physical Anesthesia Plan  ASA: III  Anesthesia Plan: Epidural   Post-op Pain Management:    Induction:   PONV Risk Score and Plan:   Airway Management Planned:   Additional Equipment:   Intra-op Plan:   Post-operative Plan:   Informed Consent: I have reviewed the patients History and Physical, chart, labs and discussed the procedure including the risks, benefits and alternatives for the proposed anesthesia with the patient or authorized representative who has indicated his/her understanding and acceptance.       Plan Discussed with:   Anesthesia Plan Comments:         Anesthesia Quick Evaluation  

## 2019-05-26 NOTE — Anesthesia Procedure Notes (Signed)
Epidural Patient location during procedure: OB Start time: 05/26/2019 6:20 AM End time: 05/26/2019 6:30 AM  Staffing Anesthesiologist: Lidia Collum, MD Performed: anesthesiologist   Preanesthetic Checklist Completed: patient identified, pre-op evaluation, timeout performed, IV checked, risks and benefits discussed and monitors and equipment checked  Epidural Patient position: sitting Prep: DuraPrep Patient monitoring: heart rate, continuous pulse ox and blood pressure Approach: midline Location: L3-L4 Injection technique: LOR air  Needle:  Needle type: Tuohy  Needle gauge: 17 G Needle length: 9 cm Needle insertion depth: 7 cm Catheter type: closed end flexible Catheter size: 19 Gauge Catheter at skin depth: 12 cm Test dose: negative  Assessment Events: blood not aspirated, injection not painful, no injection resistance, negative IV test and no paresthesia  Additional Notes Reason for block:procedure for pain

## 2019-05-26 NOTE — H&P (Addendum)
ADMISSION/ HISTORY & PHYSICAL:  Admission Date: 05/26/2019  3:32 AM  Admit Diagnosis: 39WKS CTX    Alicia Craig is a 28 y.o. female presenting for spontaneous onset of labor. Reports contractions began at 2300 on 05/25/2019.  Prenatal History: [id number]   EDC : 06/01/2019, by Ultrasound  Prenatal care at [facility] since 10+[redacted] wk gestation  Prenatal course complicated by: Anemia in third trimester, Gonorrhea in third trimester, GBS positive  Prenatal Labs: ABO, Rh: A (02/17 1521)  Antibody: PENDING (10/08 0530) Rubella: 6.17 (02/17 1521)  RPR: Non Reactive (07/30 0852)  HBsAg: Negative (02/17 1521)  HIV: Non Reactive (07/30 0852)  GBS: Positive/-- (09/17 1058)  1 hr Glucola : [phone] Genetic Screening: Normal Ultrasound: Normal anatomy, no anomalies noted on anatomy scan.    Maternal Diabetes: No Genetic Screening: Normal Maternal Ultrasounds/Referrals: Normal Fetal Ultrasounds or other Referrals:  None Maternal Substance Abuse:  No Significant Maternal Medications:  None Significant Maternal Lab Results:  Group B Strep positive and Other: Treated for Gonorrhea on 05/13/19, no TOC       Nursing Staff Provider  Office Location  [facility] Dating  U/S 10/13/2018  Language  English Anatomy US    Flu Vaccine  Declined Genetic Screen  NIPS: LR female (5/26)  AFP:   First Screen:  Quad:    TDaP vaccine   03/17/2019 Hgb A1C or  GTT Early N/A Third trimester Normal 87-108-102  Rhogam  N/A   LAB RESULTS   Feeding Plan Breast/Formula?? Blood Type A/Positive/-- (02/17 1521)   Contraception  IP nexplanon Antibody Negative (02/17 1521)  Circumcision If Boy, yes Rubella 6.17 (02/17 1521)IMMUNE  Pediatrician   [facility] RPR Non Reactive (02/17 1521)   Support Person Mom-Alicia Craig HBsAg Negative (02/17 1521)   Prenatal Classes Info given HIV Non Reactive (02/17 1521)  BTL Consent N/A GBS  POSITIVE (For PCN allergy, check sensitivities)   VBAC Consent N/A Pap  10/28/18-  negative    Hgb Electro  Normal     CF Negative    SMA 2 Copies SMN1-HR carrier for SMA    Waterbirth  [ ]  Class [ ]  Consent [ ]  CNM visit     Medical / Surgical History :  Past medical history:  Past Medical History:  Diagnosis Date  . Chlamydia 2011     Past surgical history:  Past Surgical History:  Procedure Laterality Date  . DILATION AND CURETTAGE OF UTERUS       Family History:  Family History  Problem Relation Age of Onset  . Hypertension Mother   . Diabetes Maternal Grandmother   . Down syndrome Daughter      Social History:  reports that she has never smoked. She has never used smokeless tobacco. She reports that she does not drink alcohol or use drugs.   Allergies: Patient has no known allergies.   Current Medications at time of admission:  Medications Prior to Admission  Medication Sig Dispense Refill Last Dose  . ferrous sulfate 325 (65 FE) MG EC tablet Take 1 tablet (325 mg total) by mouth 2 (two) times daily with a meal. 60 tablet 2 05/25/2019 at Unknown time  . Prenat-Fe Carbonyl-FA-Omega 3 (ONE-A-DAY WOMENS PRENATAL 1) 28-0.8-235 MG CAPS Take 1 capsule by mouth daily. 30 capsule 0 05/25/2019 at Unknown time  . terconazole (TERAZOL 3) 0.8 % vaginal cream Place 1 applicator vaginally at bedtime. 20 g 0 Past Month at Unknown time  . Blood Pressure Monitoring (BLOOD PRESSURE MONITOR AUTOMAT) DEVI  Appropriate size cuff (forearm circumference) with automatic BP monitor. To be monitored regularly at home. ICD-10 code: Z34.80, Supervision of other normal pregnancy. 1 Device 0 Unknown at Unknown time  . Elastic Bandages & Supports (COMFORT FIT MATERNITY SUPP LG) MISC 1 Units by Does not apply route daily. 1 each 0 Unknown at Unknown time  . fluticasone (FLONASE) 50 MCG/ACT nasal spray Place 1-2 sprays into both nostrils daily for 7 days. 1 g 0   . hydrocortisone (ANUSOL-HC) 2.5 % rectal cream Place 1 application rectally 2 (two) times daily. (Patient not  taking: Reported on 05/04/2019) 30 g 0      Review of Systems: Denies HA, LUQ pain, visual disturbances. Denies nausea, vomiting. Denies decreased fetal movement, LOF, vaginal bleeding.  Physical Exam: Vital signs and nursing notes reviewed.  ED Triage Vitals  Enc Vitals Group     BP 05/26/19 0357 129/69     Pulse Rate 05/26/19 0357 91     Resp 05/26/19 0357 18     Temp 05/26/19 0357 98.6 F (37 C)     Temp Source 05/26/19 0357 Oral     SpO2 --      Weight --      Height --      Head Circumference --      Peak Flow --      Pain Score 05/26/19 0342 9     Pain Loc --      Pain Edu? --      Excl. in [place]? --      General: AAO x 3, NAD, coping well Heart: RRR Lungs:CTAB Abdomen: Gravid, NT Extremities: No edema Genitalia / VE:  Dilation: 8 Effacement: 90% Station: 0 BBOW Exam by: Alicia Craig, SNM  FHR: 135 BPM, moderate variability, present accels, no decels TOCO: Ctx q 2-3 min  Labs:   Pending T&S, CBC, RPR  Recent Labs    05/26/19 0518  WBC 10.4  HGB 11.4*  HCT 35.9*  PLT 256       Assessment:  28 y.o. O1H0865 at [redacted]w[redacted]d  Active labor - 1st stage FHR category 1 GBS positive Breast and bottle feeding planned Epidural in place Placenta disposal - not yet discussed  Plan:  Admit to BS Routine L&D orders Analgesia/anesthesia PRN  GBS prophylaxis per protocol Expectant management Anticipate NSVB  Dr. Elonda Craig notified of admission / plan of care   Alicia Craig SNM, BSN 05/26/2019, 6:05 AM   CNM attestation:  I have seen and examined this patient; I agree with above documentation in the student nurse midwife's note.   Alicia Craig is a 28 y.o. [phone] here for SOL  PE: BP 107/73   Pulse 85   Temp 98.7 F (37.1 C) (Oral)   Resp 16   Ht 5\' 2"  (1.575 m)   Wt 100.7 kg   LMP 07/18/2018 (Approximate) Comment: Patient is really unsure of LMP  SpO2 100%   BMI 40.60 kg/m   Resp: normal effort, no distress Abd: gravid  ROS, labs,  PMH reviewed  Plan: Admit to Labor and Delivery Expectant management Anticipate vag del  Alicia Craig CNM 05/26/2019, 9:22 AM

## 2019-05-27 DIAGNOSIS — Z30017 Encounter for initial prescription of implantable subdermal contraceptive: Secondary | ICD-10-CM

## 2019-05-27 MED ORDER — LIDOCAINE HCL 1 % IJ SOLN
0.0000 mL | Freq: Once | INTRAMUSCULAR | Status: AC | PRN
  Administered 2019-05-27: 20:00:00 20 mL via INTRADERMAL
  Filled 2019-05-27: qty 20

## 2019-05-27 MED ORDER — ETONOGESTREL 68 MG ~~LOC~~ IMPL
68.0000 mg | DRUG_IMPLANT | Freq: Once | SUBCUTANEOUS | Status: AC
  Administered 2019-05-27: 68 mg via SUBCUTANEOUS
  Filled 2019-05-27: qty 1

## 2019-05-27 NOTE — Procedures (Signed)

## 2019-05-27 NOTE — Progress Notes (Signed)
POSTPARTUM PROGRESS NOTE  Post Partum Day 1  Subjective:  Alicia Craig is a 28 y.o. [id number] s/p SVD at [redacted]w[redacted]d.  She reports she is doing well. No acute events overnight. She denies any problems with ambulating, voiding or po intake. Denies nausea or vomiting.  Pain is moderately controlled.  Lochia is appropriate.  Objective: Blood pressure 125/83, pulse 83, temperature (!) 97.5 F (36.4 C), temperature source Oral, resp. rate 18, height 5\' 2"  (1.575 m), weight 100.7 kg, last menstrual period 07/18/2018, SpO2 97 %, unknown if currently breastfeeding.  Physical Exam:  General: alert, cooperative and no distress Chest: no respiratory distress Heart:regular rate, distal pulses intact Abdomen: soft, nontender,  Uterine Fundus: firm, appropriately tender DVT Evaluation: No calf swelling or tenderness Extremities: No LE edema Skin: warm, dry  Recent Labs    05/26/19 0518  HGB 11.4*  HCT 35.9*    Assessment/Plan: Alicia Craig is a 28 y.o. [id number] s/p SVD at [redacted]w[redacted]d   PPD#1 - Doing well  Routine postpartum care Contraception: Nexplanon. Will plan to place prior to d/c.  Feeding: Breast and bottle  Dispo: Plan for discharge PPD#2. Patient requests to stay another night.    LOS: 1 day   Phill Myron, D.O. OB Fellow  05/27/2019, 11:56 AM

## 2019-05-27 NOTE — Lactation Note (Signed)
This note was copied from a baby's chart. Lactation Consultation Note  Patient Name: Girl Tiwanna Tuch GYJEH'U Date: 05/27/2019 Reason for consult: Initial assessment;Term;Difficult latch;1st time breastfeeding  Initial visit with P4 mom who delivered @ 39wks and baby is now 63 hours old.  LC entered room to find mom in bed with baby suckling on pacifier. Mom states she hasn't tried to latch baby because her nipples are flat. Mom states she did not try to breastfeed her other children, but wants to try to breastfeed this baby.  Offered to assist mom to latch baby now as baby is suckling on pacifier; reviewed feeding cues with mom. Infant's diaper noted to be wet; changed by mom.  Reviewed various position to hold and position baby at the breast and using pillows and blankets for support.  Reviewed basic latch techniques with bottom lip touching the breast first, nose/chin touching during feeding, and wide angle at the corner of the mouth.  Mom performed hand expression with verbal instruction from [place]. Mom noted to have large soft breasts with small flat nipple that inverts with stimulation. However colostrum easily expressed by mom.  Attempted to latch baby to right breast in football hold and latch achieved but not sustained. Infant released latch in frustration. Described the differences in sucking pattern of bottle nipples vs. The breast with mom. Discussed challenges of getting baby back to breast after 24+ hours of bottle feeding. Mom fitted with #20 NS and shield pre-filled with formula using curved-tip syringe. Infant latched but released after formula emptied from shield. Infant quickly became frantic and frustrated at the breast. Mom frustrated and requests to use bottle at this time. Encouraged mom to continue to offer the breast first and then supplement with bottle. Reviewed application of shield and filling with formula. Encouraged mom to feed baby 8-12 times in 24 hours and with  feeding cues; feeding cues reinforced. Reinforced basic latch techniques. Notified of IP lactation services and brochure left with phone number. Strongly encouraged mom to call for assistance throughout the evening. Reviewed methods to stimulate milk production including using DEBP. Mom states she is interested in pumping.  Report of feeding attempt given to Buren Kos, RN and asked to set up DEBP and review with mom.  Maternal Data Has patient been taught Hand Expression?: Yes Does the patient have breastfeeding experience prior to this delivery?: No  Feeding Feeding Type: Breast Milk with Formula added  LATCH Score Latch: Grasps breast easily, tongue down, lips flanged, rhythmical sucking.  Audible Swallowing: A few with stimulation  Type of Nipple: Flat  Comfort (Breast/Nipple): Soft / non-tender  Hold (Positioning): Full assist, staff holds infant at breast  LATCH Score: 6  Interventions Interventions: Breast feeding basics reviewed;Assisted with latch;Skin to skin;Breast massage;Hand express;Position options;Support pillows  Lactation Tools Discussed/Used Tools: Nipple Shields Nipple shield size: 20   Consult Status Consult Status: Follow-up Date: 05/28/19 Follow-up type: In-patient    Cranston Neighbor 05/27/2019, 4:13 PM

## 2019-05-28 MED ORDER — IBUPROFEN 600 MG PO TABS: 600.0000 mg | ORAL_TABLET | Freq: Four times a day (QID) | ORAL | 0 refills | Status: DC | PRN

## 2019-05-28 NOTE — Discharge Instructions (Signed)

## 2019-05-28 NOTE — Lactation Note (Addendum)
This note was copied from a baby's chart. Lactation Consultation Note  Patient Name: Alicia Craig WNUUV'O Date: 05/28/2019 Reason for consult: Initial assessment;1st time breastfeeding;Term P4, 26 hour female infant -2% weight loss. Infant is currently cluster feeding. Tools: mom has harmony hand pump and 20 mm NS due having flat and invert nipples. Mom's current feeding choice is breast and formula feeding infant. This is mom's first time breastfeeding, mom is working on latching infant to breast. Mom is using 20 mm NS, per mom, she pump with hand pump got 10 ml of colostrum. Mom gave infant 10 ml of colostrum using foley cup. Infant latched on mom's right breast, mom latched infant using football hold with  20 mm NS, LC assisted mom on infant sustaining latch, infant breastfed for 10 minutes. Per mom, this is the longest infant sustained latch instead of being on and off breast. Mom knows to call Nurse or [place] if she needs assistance with latching infant at breast. Mom's goal: 1. Mom plans to continue working on latching infant at breast with 20 mm NS. 2. Mom knows to breastfeed according hunger cues, 8 to 12 times within 24 hours and on demand. 3. Mom will give infant back EBM that she pumps after breastfeeding infant.  Maternal Data    Feeding    LATCH Score Latch: Grasps breast easily, tongue down, lips flanged, rhythmical sucking.  Audible Swallowing: Spontaneous and intermittent  Type of Nipple: Inverted  Comfort (Breast/Nipple): Soft / non-tender  Hold (Positioning): Assistance needed to correctly position infant at breast and maintain latch.  LATCH Score: 7  Interventions Interventions: Adjust position;Support pillows;Position options;Breast massage;Skin to skin;Assisted with latch;Hand pump;Breast compression  Lactation Tools Discussed/Used Tools: Nipple Shields Nipple shield size: 20   Consult Status Consult Status: Follow-up Date:  05/28/19 Follow-up type: In-patient    Vicente Serene 05/28/2019, 12:09 AM

## 2019-05-28 NOTE — Lactation Note (Signed)
This note was copied from a baby's chart. Lactation Consultation Note  Patient Name: Alicia Craig SWNIO'E Date: 05/28/2019   P4, Baby 25 hours old.  First time breastfeeding.  Mother sleepy during consult. Mother states she is breastfeeding with NS and formula feeding. Her last feeding was formula only. Discussed supply and demand and the importance of bf before offering formula to help establish her milk supply. Discussed using pump for 10 min per side after bf with NS to help milk supply. Feed on demand with cues.  Goal 8-12+ times per day after first 24 hrs.  Place baby STS if not cueing.  Reviewed engorgement care and monitoring voids/stools. Provided additional NS for discharge.  Suggest mother call if she would like assistance w/ latching.      Maternal Data    Feeding Feeding Type: Bottle Fed - Formula Nipple Type: Slow - flow  LATCH Score                   Interventions    Lactation Tools Discussed/Used     Consult Status      Carlye Grippe 05/28/2019, 10:37 AM

## 2019-06-23 ENCOUNTER — Telehealth: Payer: Medicaid Other | Admitting: Obstetrics and Gynecology

## 2019-07-08 ENCOUNTER — Other Ambulatory Visit: Payer: Self-pay

## 2019-07-08 ENCOUNTER — Ambulatory Visit
Admission: EM | Admit: 2019-07-08 | Discharge: 2019-07-08 | Disposition: A | Discharge status: Home / Self Care | Payer: Medicaid Other | Attending: Physician Assistant | Admitting: Physician Assistant

## 2019-07-08 DIAGNOSIS — Z113 Encounter for screening for infections with a predominantly sexual mode of transmission: Secondary | ICD-10-CM | POA: Insufficient documentation

## 2019-07-08 DIAGNOSIS — H01024 Squamous blepharitis left upper eyelid: Secondary | ICD-10-CM | POA: Diagnosis not present

## 2019-07-08 DIAGNOSIS — H1032 Unspecified acute conjunctivitis, left eye: Secondary | ICD-10-CM | POA: Insufficient documentation

## 2019-07-08 DIAGNOSIS — H6982 Other specified disorders of Eustachian tube, left ear: Secondary | ICD-10-CM | POA: Insufficient documentation

## 2019-07-08 MED ORDER — FLUTICASONE PROPIONATE 50 MCG/ACT NA SUSP: 2.0000 | Freq: Every day | NASAL | 0 refills | Status: DC

## 2019-07-08 MED ORDER — ERYTHROMYCIN 5 MG/GM OP OINT: TOPICAL_OINTMENT | OPHTHALMIC | 0 refills | Status: DC

## 2019-07-08 NOTE — ED Provider Notes (Signed)
[facility]    CSN: [id number] Arrival date & time: 07/08/19  1031      History   Chief Complaint Chief Complaint  Patient presents with  . Eye Drainage    HPI Alicia Craig is a 28 y.o. female.   28 year old female comes in for multiple complaints.  1. 3 day history of left eye redness. Prior to symptom onset, she had been crying and felt eye to be swollen. She then started having itching of the eye/eyelid with eye watering. However, now with eye redness, pain, crusting in the morning. Denies photophobia. Blurry vision, though she feels is associated with eye watering. Denies contact lens/glasses use. Has nasal congestion/rhinorrhea associated with eye watering. Denies other URI symptoms. Denies fever, chills, body aches. Denies shortness of breath, loss of taste/smell.  Recent pregnancy, daughter 20 month old. Denies eye injury/trauma.    2. Left ear pain. This episode started with left eye redness. However, has had similar episodes intermittently for a few months. Pain is intermittent. Denies drainage. Has some muffled hearing at times. Has not tried any medications.   3. STD check. Patient was tested positive for GC during pregnancy with treatment. States had sexual encounter with same partner after giving birth, and unsure if partner was tested/treated. She has had 3 day history of vaginal discharge, itching. Denies abdominal pain, nausea, vomiting, diarrhea. Denies urinary symptoms such as frequency, dysuria, hematuria. Has nexplanon, intermittent spotting.      Past Medical History:  Diagnosis Date  . Chlamydia 2011    Patient Active Problem List   Diagnosis Date Noted  . Indication for care in labor or delivery 05/26/2019  . Vaginal delivery 05/26/2019  . Positive GBS test 05/13/2019  . Gonorrhea affecting pregnancy in third trimester 05/13/2019  . Anemia affecting pregnancy in third trimester 03/18/2019  . Supervision of other normal pregnancy,  antepartum 10/04/2018  . Previous child with Down syndrome, antepartum 05/09/2014    Past Surgical History:  Procedure Laterality Date  . DILATION AND CURETTAGE OF UTERUS      OB History    Gravida  5   Para  4   Term  4   Preterm      AB  1   Living  4     SAB  1   TAB      Ectopic      Multiple  0   Live Births  4            Home Medications    Prior to Admission medications   Medication Sig Start Date End Date Taking? Authorizing Provider  erythromycin ophthalmic ointment Place a 1/2 inch ribbon of ointment into left eye 4 times a day for  7 days 07/08/19   Belinda Fisher, PA-C  ferrous sulfate 325 (65 FE) MG EC tablet Take 1 tablet (325 mg total) by mouth 2 (two) times daily with a meal. 03/18/19   Raelyn Mora, CNM  fluticasone (FLONASE) 50 MCG/ACT nasal spray Place 2 sprays into both nostrils daily. 07/08/19   Cathie Hoops, Ashlin Kreps V, PA-C  ibuprofen (ADVIL) 600 MG tablet Take 1 tablet (600 mg total) by mouth every 6 (six) hours as needed. 05/28/19   Arabella Merles, CNM  Prenat-Fe Carbonyl-FA-Omega 3 (ONE-A-DAY WOMENS PRENATAL 1) 28-0.8-235 MG CAPS Take 1 capsule by mouth daily. 10/28/18   Sharyon Cable, CNM    Family History Family History  Problem Relation Age of Onset  . Hypertension Mother   .  Diabetes Maternal Grandmother   . Down syndrome Daughter     Social History Social History   Tobacco Use  . Smoking status: Never Smoker  . Smokeless tobacco: Never Used  Substance Use Topics  . Alcohol use: No  . Drug use: No     Allergies   Patient has no known allergies.   Review of Systems Review of Systems  Reason unable to perform ROS: See HPI as above.     Physical Exam Triage Vital Signs ED Triage Vitals  Enc Vitals Group     BP 07/08/19 1038 122/82     Pulse Rate 07/08/19 1038 78     Resp 07/08/19 1038 16     Temp 07/08/19 1038 99 F (37.2 C)     Temp Source 07/08/19 1038 Oral     SpO2 07/08/19 1038 97 %     Weight --       Height --      Head Circumference --      Peak Flow --      Pain Score 07/08/19 1045 1     Pain Loc --      Pain Edu? --      Excl. in [place]? --    No data found.  Updated Vital Signs BP 122/82 (BP Location: Left Arm)   Pulse 78   Temp 99 F (37.2 C) (Oral)   Resp 16   LMP 07/01/2019 Comment: spotting pt has new nexplanon  SpO2 97%   Breastfeeding No   Visual Acuity Right Eye Distance: 20/40 Left Eye Distance: 20/70 Bilateral Distance: 20/30  Right Eye Near:   Left Eye Near:    Bilateral Near:     Physical Exam Constitutional:      General: She is not in acute distress.    Appearance: She is well-developed. She is not ill-appearing, toxic-appearing or diaphoretic.  HENT:     Head: Normocephalic and atraumatic.     Right Ear: Tympanic membrane, ear canal and external ear normal. Tympanic membrane is not erythematous or bulging.     Left Ear: Tympanic membrane, ear canal and external ear normal. Tympanic membrane is not erythematous or bulging.     Mouth/Throat:     Mouth: Mucous membranes are moist.     Pharynx: Oropharynx is clear. Uvula midline.  Eyes:     Extraocular Movements: Extraocular movements intact.     Conjunctiva/sclera:     Right eye: Right conjunctiva is not injected.     Left eye: Left conjunctiva is injected.     Pupils: Pupils are equal, round, and reactive to light.     Comments: Left upper eyelid swelling without erythema, warmth. No hordeolum felt. Diffuse tenderness to palpation. No photophobia on exam.   Cardiovascular:     Rate and Rhythm: Normal rate and regular rhythm.     Heart sounds: Normal heart sounds. No murmur. No friction rub. No gallop.   Pulmonary:     Effort: Pulmonary effort is normal.     Breath sounds: Normal breath sounds. No wheezing or rales.  Abdominal:     General: Bowel sounds are normal.     Palpations: Abdomen is soft.     Tenderness: There is no abdominal tenderness. There is no right CVA tenderness, left CVA  tenderness, guarding or rebound.  Skin:    General: Skin is warm and dry.  Neurological:     Mental Status: She is alert and oriented to person, place, and time.  Psychiatric:  Behavior: Behavior normal.        Judgment: Judgment normal.      UC Treatments / Results  Labs (all labs ordered are listed, but only abnormal results are displayed) Labs Reviewed  CERVICOVAGINAL ANCILLARY ONLY    EKG   Radiology No results found.  Procedures Procedures (including critical care time)  Medications Ordered in UC Medications - No data to display  Initial Impression / Assessment and Plan / UC Course  I have reviewed the triage vital signs and the nursing notes.  Pertinent labs & imaging results that were available during my care of the patient were reviewed by me and considered in my medical decision making (see chart for details).    1. Conjunctivitis/blepharitis No signs of preseptal/orbital cellulitis. Start erythromycin ointment as directed. Lids scrubs and warm compress. Patient to follow up with ophthalmology if symptoms worsens or does not improve. Return precautions given.   2. Left ear pain/eustachian tube dysfunction. Will have patient start flonase/nasacort for symptoms. Can also add on otc antihistamine if needed. Return precautions given.  3. STD screening.  Cytology sent, patient will be contacted with any positive results that require additional treatment. Patient to refrain from sexual activity for the next 7 days. Return precautions given.   Patient expresses understanding and agrees to plan.  Final Clinical Impressions(s) / UC Diagnoses   Final diagnoses:  Squamous blepharitis of left upper eyelid  Acute conjunctivitis of left eye, unspecified acute conjunctivitis type  Eustachian tube dysfunction, left  Screen for STD (sexually transmitted disease)   ED Prescriptions    Medication Sig Dispense Auth. Provider   erythromycin ophthalmic ointment  Place a 1/2 inch ribbon of ointment into left eye 4 times a day for  7 days 1 g Alianny Toelle V, PA-C   fluticasone (FLONASE) 50 MCG/ACT nasal spray Place 2 sprays into both nostrils daily. 1 g Belinda FisherYu, Momodou Consiglio V, PA-C     PDMP not reviewed this encounter.   Belinda FisherYu, Khali Perella V, PA-C 07/08/19 1140

## 2019-07-08 NOTE — Discharge Instructions (Addendum)
Start erythromycin as directed. Lid scrubs and warm compresses as directed. Monitor for any worsening of symptoms, changes in vision, sensitivity to light, eye swelling, painful eye movement, follow up with ophthalmology for further evaluation.   Start flonase for left ear pain/eustachian tube dysfunction. I have attached some information for you if you'd like to read about it.  Testing sent, you will be contacted with any positive results that requires further treatment. Refrain from sexual activity for the next 7 days. Monitor for any worsening of symptoms, fever, abdominal pain, nausea, vomiting, to follow up for reevaluation.

## 2019-07-08 NOTE — ED Triage Notes (Signed)
Pt c/o lt eye drainage, itchy, red and tender to touch x3 days

## 2019-07-10 ENCOUNTER — Telehealth: Payer: Self-pay | Admitting: Emergency Medicine

## 2019-07-10 MED ORDER — ERYTHROMYCIN 5 MG/GM OP OINT: TOPICAL_OINTMENT | OPHTHALMIC | 0 refills | Status: DC

## 2019-07-10 NOTE — Telephone Encounter (Signed)
Patient called stating her daughter squeezed the rest of her tube of ointment out by accident.  New prescription sent to pharmacy on file.

## 2019-07-11 LAB — CERVICOVAGINAL ANCILLARY ONLY
Chlamydia: NEGATIVE
Neisseria Gonorrhea: NEGATIVE
Trichomonas: NEGATIVE

## 2019-07-13 ENCOUNTER — Telehealth (INDEPENDENT_AMBULATORY_CARE_PROVIDER_SITE_OTHER): Payer: Medicaid Other | Admitting: Obstetrics and Gynecology

## 2019-07-13 ENCOUNTER — Other Ambulatory Visit: Payer: Self-pay

## 2019-07-13 ENCOUNTER — Encounter: Payer: Self-pay | Admitting: Emergency Medicine

## 2019-07-13 ENCOUNTER — Ambulatory Visit
Admission: EM | Admit: 2019-07-13 | Discharge: 2019-07-13 | Disposition: A | Discharge status: Home / Self Care | Payer: Medicaid Other | Attending: Emergency Medicine | Admitting: Emergency Medicine

## 2019-07-13 DIAGNOSIS — S0502XA Injury of conjunctiva and corneal abrasion without foreign body, left eye, initial encounter: Secondary | ICD-10-CM | POA: Diagnosis not present

## 2019-07-13 MED ORDER — ERYTHROMYCIN 5 MG/GM OP OINT: TOPICAL_OINTMENT | OPHTHALMIC | 0 refills | Status: DC

## 2019-07-13 MED ORDER — LUBRICANT EYE DROPS 0.4-0.3 % OP SOLN: 1.0000 [drp] | Freq: Three times a day (TID) | OPHTHALMIC | 0 refills | Status: DC | PRN

## 2019-07-13 NOTE — ED Triage Notes (Signed)
Pt presents to [facility] for continuing redness and watering of the left eye after antibiotic ointment treatment.  Patient states it has now traveled to her other eye.  States her eyes have become sensitive to light.

## 2019-07-13 NOTE — Discharge Instructions (Addendum)
Use ointment as discussed. May also use eyedrops for additional relief. Need to follow-up with ophthalmology in the next week to ensure issue has resolved. See ophthalmology sooner than 1 week if you develop change in vision, severe headache, or unable to open your eye.

## 2019-07-13 NOTE — Progress Notes (Signed)
WEBEX VIDEO POSTPARTUM VISIT ENCOUNTER NOTE  I connected with@ on 07/13/19 at  3:30 PM EST by WebEx video at home and verified that I am speaking with the correct person using two identifiers.   I discussed the limitations, risks, security and privacy concerns of performing an evaluation and management service by WebEx video and the availability of in person appointments. I also discussed with the patient that there may be a patient responsible charge related to this service. The patient expressed understanding and agreed to proceed.  Appointment Date: 07/13/2019  OBGYN Clinic: [facility]  Chief Complaint:  Postpartum Visit  History of Present Illness: CHIKITA DOGAN is a 28 y.o. African-American [id number] (Patient's last menstrual period was 07/01/2019.), seen for the above chief complaint. Her past medical history is significant for none.   She is s/p normal spontaneous vaginal delivery on 05/26/2019 at 39 weeks; she was discharged to home on 10/11/2020D#3. Pregnancy uncomplicated. Baby is doing well.  Complains of feeling a little overwhelmed with all her kids, but "doing okay"  Vaginal bleeding or discharge: No  Mode of feeding infant: Bottle Intercourse: Yes  Contraception: Nexplaonon PP depression s/s: Yes . Score: 13 Any bowel or bladder issues: Yes  Pap smear: no abnormalities (date: 10/28/2018)  Review of Systems: Her 12 point review of systems is negative or as noted in the History of Present Illness.  Patient Active Problem List   Diagnosis Date Noted  . Indication for care in labor or delivery 05/26/2019  . Vaginal delivery 05/26/2019  . Positive GBS test 05/13/2019  . Gonorrhea affecting pregnancy in third trimester 05/13/2019  . Anemia affecting pregnancy in third trimester 03/18/2019  . Supervision of other normal pregnancy, antepartum 10/04/2018  . Previous child with Down syndrome, antepartum 05/09/2014    Medications Telena Peyser. Nazario had no  medications administered during this visit. Current Outpatient Medications  Medication Sig Dispense Refill  . erythromycin ophthalmic ointment Place a 1/2 inch ribbon of ointment into left eye 4 times a day for  7 days 1 g 0  . ferrous sulfate 325 (65 FE) MG EC tablet Take 1 tablet (325 mg total) by mouth 2 (two) times daily with a meal. 60 tablet 2  . fluticasone (FLONASE) 50 MCG/ACT nasal spray Place 2 sprays into both nostrils daily. 1 g 0  . ibuprofen (ADVIL) 600 MG tablet Take 1 tablet (600 mg total) by mouth every 6 (six) hours as needed. 30 tablet 0  . Polyethyl Glycol-Propyl Glycol (LUBRICANT EYE DROPS) 0.4-0.3 % SOLN Apply 1 drop to eye 3 (three) times daily as needed. 10 mL 0  . Prenat-Fe Carbonyl-FA-Omega 3 (ONE-A-DAY WOMENS PRENATAL 1) 28-0.8-235 MG CAPS Take 1 capsule by mouth daily. 30 capsule 0   No current facility-administered medications for this visit.     Allergies Patient has no known allergies.  Physical Exam:  General:  Alert, oriented and cooperative.   Mental Status: Normal mood and affect perceived. Normal judgment and thought content.  Rest of physical exam deferred due to type of encounter  PP Depression Screening:  Positive score = 13  Assessment:Patient is a 28 y.o. [id number] who is 6 weeks postpartum from a normal spontaneous vaginal delivery.  She is doing well.   Plan: Encounter for postpartum visit - Offered to refer to [facility] - pt declined - Has Nexplanon  RTC 1 year for AEX via [facility]  I discussed the assessment and treatment plan with the patient. The patient was provided an  opportunity to ask questions and all were answered. The patient agreed with the plan and demonstrated an understanding of the instructions.   The patient was advised to call back or seek an in-person evaluation/go to the ED for any concerning postpartum symptoms.  I provided 10 minutes of non-face-to-face time during this encounter. There was 5 minutes of chart review  time spent prior to this encounter. Total time spent = 15 minutes.   Laury Deep, [place] for [place]

## 2019-07-13 NOTE — ED Notes (Signed)
Patient able to ambulate independently  

## 2019-07-13 NOTE — ED Provider Notes (Signed)
[facility]    CSN: [id number] Arrival date & time: 07/13/19  0810      History   Chief Complaint Chief Complaint  Patient presents with  . eye watering    HPI Alicia Craig is a 28 y.o. female presenting for redness, watering of left eye.  Patient previously evaluated for this on 11/20: Records reviewed via telephone.  Patient was given erythromycin ointment for empiric treatment of conjunctivitis.  Patient states she has been applying erythromycin ointment directly to her eye from the tube, not on her finger as instructed.  Endorsing photophobia.  Denies contact, glasses use   Past Medical History:  Diagnosis Date  . Chlamydia 2011    Patient Active Problem List   Diagnosis Date Noted  . Indication for care in labor or delivery 05/26/2019  . Vaginal delivery 05/26/2019  . Positive GBS test 05/13/2019  . Gonorrhea affecting pregnancy in third trimester 05/13/2019  . Anemia affecting pregnancy in third trimester 03/18/2019  . Supervision of other normal pregnancy, antepartum 10/04/2018  . Previous child with Down syndrome, antepartum 05/09/2014    Past Surgical History:  Procedure Laterality Date  . DILATION AND CURETTAGE OF UTERUS      OB History    Gravida  5   Para  4   Term  4   Preterm      AB  1   Living  4     SAB  1   TAB      Ectopic      Multiple  0   Live Births  4            Home Medications    Prior to Admission medications   Medication Sig Start Date End Date Taking? Authorizing Provider  erythromycin ophthalmic ointment Place a 1/2 inch ribbon of ointment into left eye 4 times a day for  7 days 07/13/19   Hall-Potvin, [place], PA-C  ferrous sulfate 325 (65 FE) MG EC tablet Take 1 tablet (325 mg total) by mouth 2 (two) times daily with a meal. 03/18/19   Laury Deep, CNM  fluticasone (FLONASE) 50 MCG/ACT nasal spray Place 2 sprays into both nostrils daily. 07/08/19   Tasia Catchings, Amy V, PA-C  ibuprofen (ADVIL)  600 MG tablet Take 1 tablet (600 mg total) by mouth every 6 (six) hours as needed. 05/28/19   Myrtis Ser, CNM  Polyethyl Glycol-Propyl Glycol (LUBRICANT EYE DROPS) 0.4-0.3 % SOLN Apply 1 drop to eye 3 (three) times daily as needed. 07/13/19   Hall-Potvin, [place], PA-C  Prenat-Fe Carbonyl-FA-Omega 3 (ONE-A-DAY WOMENS PRENATAL 1) 28-0.8-235 MG CAPS Take 1 capsule by mouth daily. 10/28/18   Lajean Manes, CNM    Family History Family History  Problem Relation Age of Onset  . Hypertension Mother   . Diabetes Maternal Grandmother   . Down syndrome Daughter     Social History Social History   Tobacco Use  . Smoking status: Never Smoker  . Smokeless tobacco: Never Used  Substance Use Topics  . Alcohol use: No  . Drug use: No     Allergies   Patient has no known allergies.   Review of Systems Review of Systems  Constitutional: Negative for fatigue and fever.  HENT: Negative for congestion, dental problem, ear pain, facial swelling, hearing loss, sinus pain, sore throat, trouble swallowing and voice change.   Eyes: Positive for photophobia, pain, discharge and redness. Negative for itching and visual disturbance.  Respiratory: Negative for cough  and shortness of breath.   Cardiovascular: Negative for chest pain and palpitations.  Gastrointestinal: Negative for diarrhea and vomiting.  Musculoskeletal: Negative for arthralgias and myalgias.  Neurological: Negative for dizziness and headaches.     Physical Exam Triage Vital Signs ED Triage Vitals  Enc Vitals Group     BP      Pulse      Resp      Temp      Temp src      SpO2      Weight      Height      Head Circumference      Peak Flow      Pain Score      Pain Loc      Pain Edu?      Excl. in [place]?    No data found.  Updated Vital Signs BP 128/83 (BP Location: Left Arm)   Pulse 77   Temp 97.9 F (36.6 C) (Temporal)   Resp 18   LMP 07/01/2019 Comment: spotting pt has new nexplanon  SpO2 98%    Visual Acuity Right Eye Distance:   Left Eye Distance:   Bilateral Distance:    Right Eye Near: R Near: 20/30 Left Eye Near:  L Near: 20/50 Bilateral Near:  20/20  Physical Exam Constitutional:      General: She is not in acute distress.    Appearance: She is obese. She is not ill-appearing.  HENT:     Head: Normocephalic and atraumatic.     Jaw: There is normal jaw occlusion. No tenderness or pain on movement.     Right Ear: Hearing normal. No tenderness. No mastoid tenderness.     Left Ear: Hearing normal. No tenderness. No mastoid tenderness.     Nose: No nasal deformity, septal deviation or nasal tenderness.     Right Turbinates: Not swollen or pale.     Left Turbinates: Not swollen or pale.     Right Sinus: No maxillary sinus tenderness or frontal sinus tenderness.     Left Sinus: No maxillary sinus tenderness or frontal sinus tenderness.     Mouth/Throat:     Lips: Pink. No lesions.     Mouth: Mucous membranes are moist. No injury.     Pharynx: Uvula midline. No uvula swelling.  Eyes:     General: Lids are normal. Lids are everted, no foreign bodies appreciated. Vision grossly intact. Gaze aligned appropriately. No scleral icterus.       Right eye: No foreign body, discharge or hordeolum.        Left eye: Discharge present.No foreign body or hordeolum.     Extraocular Movements: Extraocular movements intact.     Right eye: No nystagmus.     Left eye: No nystagmus.     Conjunctiva/sclera:     Right eye: Right conjunctiva is not injected.     Left eye: Left conjunctiva is injected. No exudate or hemorrhage.    Pupils: Pupils are equal, round, and reactive to light.      Comments: Moderate conjunctivitis of left eye  Neck:     Musculoskeletal: Normal range of motion and neck supple. No muscular tenderness.  Cardiovascular:     Rate and Rhythm: Normal rate.  Pulmonary:     Effort: Pulmonary effort is normal. No respiratory distress.     Breath sounds: No wheezing.   Lymphadenopathy:     Cervical: No cervical adenopathy.  Neurological:     Mental Status: She  is alert and oriented to person, place, and time.      UC Treatments / Results  Labs (all labs ordered are listed, but only abnormal results are displayed) Labs Reviewed - No data to display  EKG   Radiology No results found.  Procedures Procedures (including critical care time)  Medications Ordered in UC Medications - No data to display  Initial Impression / Assessment and Plan / UC Course  I have reviewed the triage vital signs and the nursing notes.  Pertinent labs & imaging results that were available during my care of the patient were reviewed by me and considered in my medical decision making (see chart for details).     Corneal abrasion likely second to direct application of antibiotic ointment from tube to her eye.  Patient to get new tube of erythromycin as old tube is no longer sterile.  Reviewed at length proper administration of erythromycin ointment, which patient verbalized understanding.  Patient to make follow-up appointment with ophthalmology in 1 week for reevaluation.  Return precautions discussed, patient verbalized understanding and is agreeable to plan. Final Clinical Impressions(s) / UC Diagnoses   Final diagnoses:  Abrasion of left cornea, initial encounter     Discharge Instructions     Use management as discussed. May also use eyedrops for additional relief. Need to follow-up with ophthalmology in the next week to ensure issue has resolved. See ophthalmology sooner than 1 week if you develop change in vision, severe headache, or unable to open your eye.    ED Prescriptions    Medication Sig Dispense Auth. Provider   erythromycin ophthalmic ointment Place a 1/2 inch ribbon of ointment into left eye 4 times a day for  7 days 1 g Hall-Potvin, [place], PA-C   Polyethyl Glycol-Propyl Glycol (LUBRICANT EYE DROPS) 0.4-0.3 % SOLN Apply 1 drop to eye 3  (three) times daily as needed. 10 mL Hall-Potvin, [place], PA-C     PDMP not reviewed this encounter.   Hall-Potvin, [place], [place] 07/13/19 [phone]

## 2019-07-15 ENCOUNTER — Encounter: Payer: Self-pay | Admitting: Obstetrics and Gynecology

## 2019-09-01 ENCOUNTER — Ambulatory Visit
Admission: EM | Admit: 2019-09-01 | Discharge: 2019-09-01 | Disposition: A | Discharge status: Home / Self Care | Payer: Medicaid Other | Attending: Physician Assistant | Admitting: Physician Assistant

## 2019-09-01 DIAGNOSIS — N898 Other specified noninflammatory disorders of vagina: Secondary | ICD-10-CM | POA: Diagnosis not present

## 2019-09-01 LAB — POCT URINALYSIS DIP (MANUAL ENTRY)
Bilirubin, UA: NEGATIVE
Blood, UA: NEGATIVE
Glucose, UA: NEGATIVE mg/dL
Ketones, POC UA: NEGATIVE mg/dL
Leukocytes, UA: NEGATIVE
Nitrite, UA: NEGATIVE
Protein Ur, POC: NEGATIVE mg/dL
Spec Grav, UA: >=1.03 — AB (ref 1.010–1.025)
Urobilinogen, UA: 0.2 E.U./dL
pH, UA: 6 (ref 5.0–8.0)

## 2019-09-01 MED ORDER — METRONIDAZOLE 500 MG PO TABS: 500.0000 mg | ORAL_TABLET | Freq: Two times a day (BID) | ORAL | 0 refills | Status: DC

## 2019-09-01 MED ORDER — FLUCONAZOLE 150 MG PO TABS: 150.0000 mg | ORAL_TABLET | Freq: Every day | ORAL | 0 refills | Status: DC

## 2019-09-01 NOTE — ED Triage Notes (Signed)
Pt c/o dark urine with a strong smell and a vaginal discharge and a odor x1wk.

## 2019-09-01 NOTE — ED Provider Notes (Signed)
[facility]    CSN: [id number] Arrival date & time: 09/01/19  1159      History   Chief Complaint Chief Complaint  Patient presents with  . Urinary Tract Infection    HPI Alicia Craig is a 29 y.o. female.   29 year old female comes in for 7 day history of urinary symptoms and vaginal discharge. Has had frequency, without dysuria, hematuria. Has had more vaginal discharge with odor. Some vaginal itching. Denies abdominal pain, nausea, vomiting. Denies fever, chills, flank/back pain. Nexplanon implant 05/2019, does not get cycles. Sexually active, 1 female partner, no condom use. Changes with body wash frequently.      Past Medical History:  Diagnosis Date  . Chlamydia 2011    Patient Active Problem List   Diagnosis Date Noted  . Indication for care in labor or delivery 05/26/2019  . Vaginal delivery 05/26/2019  . Positive GBS test 05/13/2019  . Gonorrhea affecting pregnancy in third trimester 05/13/2019  . Anemia affecting pregnancy in third trimester 03/18/2019  . Supervision of other normal pregnancy, antepartum 10/04/2018  . Previous child with Down syndrome, antepartum 05/09/2014    Past Surgical History:  Procedure Laterality Date  . DILATION AND CURETTAGE OF UTERUS      OB History    Gravida  5   Para  4   Term  4   Preterm      AB  1   Living  4     SAB  1   TAB      Ectopic      Multiple  0   Live Births  4            Home Medications    Prior to Admission medications   Medication Sig Start Date End Date Taking? Authorizing Provider  ferrous sulfate 325 (65 FE) MG EC tablet Take 1 tablet (325 mg total) by mouth 2 (two) times daily with a meal. 03/18/19   Raelyn Mora, CNM  fluconazole (DIFLUCAN) 150 MG tablet Take 1 tablet (150 mg total) by mouth daily. Take second dose 72 hours later if symptoms still persists. 09/01/19   Cathie Hoops, Tyrell Seifer V, PA-C  metroNIDAZOLE (FLAGYL) 500 MG tablet Take 1 tablet (500 mg total) by  mouth 2 (two) times daily. 09/01/19   Raea Magallon V, PA-C  Prenat-Fe Carbonyl-FA-Omega 3 (ONE-A-DAY WOMENS PRENATAL 1) 28-0.8-235 MG CAPS Take 1 capsule by mouth daily. 10/28/18   Sharyon Cable, CNM    Family History Family History  Problem Relation Age of Onset  . Hypertension Mother   . Diabetes Maternal Grandmother   . Down syndrome Daughter     Social History Social History   Tobacco Use  . Smoking status: Never Smoker  . Smokeless tobacco: Never Used  Substance Use Topics  . Alcohol use: No  . Drug use: No     Allergies   Patient has no known allergies.   Review of Systems Review of Systems  Reason unable to perform ROS: See HPI as above.     Physical Exam Triage Vital Signs ED Triage Vitals  Enc Vitals Group     BP 09/01/19 1208 119/76     Pulse Rate 09/01/19 1208 93     Resp 09/01/19 1208 16     Temp 09/01/19 1208 99.1 F (37.3 C)     Temp Source 09/01/19 1208 Oral     SpO2 09/01/19 1208 99 %     Weight --  Height --      Head Circumference --      Peak Flow --      Pain Score 09/01/19 1209 0     Pain Loc --      Pain Edu? --      Excl. in [place]? --    No data found.  Updated Vital Signs BP 119/76 (BP Location: Left Arm)   Pulse 93   Temp 99.1 F (37.3 C) (Oral)   Resp 16   SpO2 99%   Breastfeeding No   Physical Exam Constitutional:      General: She is not in acute distress.    Appearance: She is well-developed. She is not ill-appearing, toxic-appearing or diaphoretic.  HENT:     Head: Normocephalic and atraumatic.  Eyes:     Conjunctiva/sclera: Conjunctivae normal.     Pupils: Pupils are equal, round, and reactive to light.  Cardiovascular:     Rate and Rhythm: Normal rate and regular rhythm.  Pulmonary:     Effort: Pulmonary effort is normal. No respiratory distress.     Comments: LCTAB Abdominal:     General: Bowel sounds are normal.     Palpations: Abdomen is soft.     Tenderness: There is no abdominal tenderness. There is  no right CVA tenderness, left CVA tenderness, guarding or rebound.  Musculoskeletal:     Cervical back: Normal range of motion and neck supple.  Skin:    General: Skin is warm and dry.  Neurological:     Mental Status: She is alert and oriented to person, place, and time.  Psychiatric:        Behavior: Behavior normal.        Judgment: Judgment normal.      UC Treatments / Results  Labs (all labs ordered are listed, but only abnormal results are displayed) Labs Reviewed  POCT URINALYSIS DIP (MANUAL ENTRY) - Abnormal; Notable for the following components:      Result Value   Spec Grav, UA >=1.030 (*)    All other components within normal limits    EKG   Radiology No results found.  Procedures Procedures (including critical care time)  Medications Ordered in UC Medications - No data to display  Initial Impression / Assessment and Plan / UC Course  I have reviewed the triage vital signs and the nursing notes.  Pertinent labs & imaging results that were available during my care of the patient were reviewed by me and considered in my medical decision making (see chart for details).    Patient gets frequent vaginal discharge/odor symptoms, states frequently changes soaps.  Will have patient try avoid changes in hygiene products at this time.  Will cover for BV and yeast.  Start Flagyl and Diflucan as directed.  Urine without leukocytes or nitrites.  Discussed possible dehydration causing symptoms, to push fluids.  Patient deferred STD testing.  Will have patient avoid alcohol no sexual activity until finishing treatment.  Return precautions given.  Patient expresses understanding and agrees to plan.   Final Clinical Impressions(s) / UC Diagnoses   Final diagnoses:  Vaginal discharge  Vaginal odor   ED Prescriptions    Medication Sig Dispense Auth. Provider   metroNIDAZOLE (FLAGYL) 500 MG tablet Take 1 tablet (500 mg total) by mouth 2 (two) times daily. 14 tablet Lamyra Malcolm  V, PA-C   fluconazole (DIFLUCAN) 150 MG tablet Take 1 tablet (150 mg total) by mouth daily. Take second dose 72 hours later if symptoms  still persists. 2 tablet Belinda Fisher, PA-C     PDMP not reviewed this encounter.   Belinda Fisher, PA-C 09/01/19 1241

## 2019-09-01 NOTE — Discharge Instructions (Signed)
You were treated empirically for BV and yeast. Start diflucan and flagyl as directed. Refrain from sexual activity and alcohol use for the next 7 days. Monitor for any worsening of symptoms, fever, abdominal pain, nausea, vomiting, to follow up for reevaluation.

## 2019-10-19 ENCOUNTER — Telehealth: Payer: Medicaid Other | Admitting: Family

## 2019-10-19 DIAGNOSIS — R399 Unspecified symptoms and signs involving the genitourinary system: Secondary | ICD-10-CM | POA: Diagnosis not present

## 2019-10-19 MED ORDER — CEPHALEXIN 500 MG PO CAPS: 500.0000 mg | ORAL_CAPSULE | Freq: Two times a day (BID) | ORAL | 0 refills | Status: DC

## 2019-10-19 NOTE — Progress Notes (Signed)

## 2019-10-24 ENCOUNTER — Ambulatory Visit: Payer: Medicaid Other

## 2020-01-23 ENCOUNTER — Telehealth: Payer: Medicaid Other

## 2020-03-08 ENCOUNTER — Ambulatory Visit: Payer: Medicaid Other | Admitting: Obstetrics and Gynecology

## 2020-08-01 IMAGING — US US MFM OB FOLLOW UP
1 series · 13 of 28 positions shown · non-contrast
Comparison: none

[Series 1: us mfm ob follow up · 50 acquisitions, 13 frames shown]
[im 2/50]
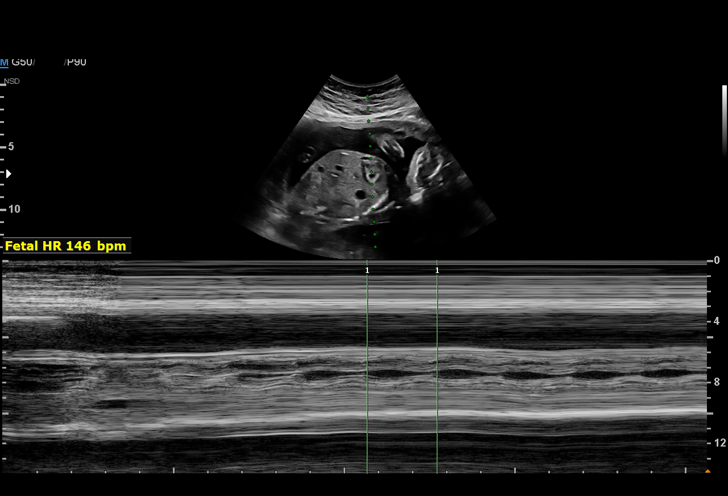
[im 6/50]
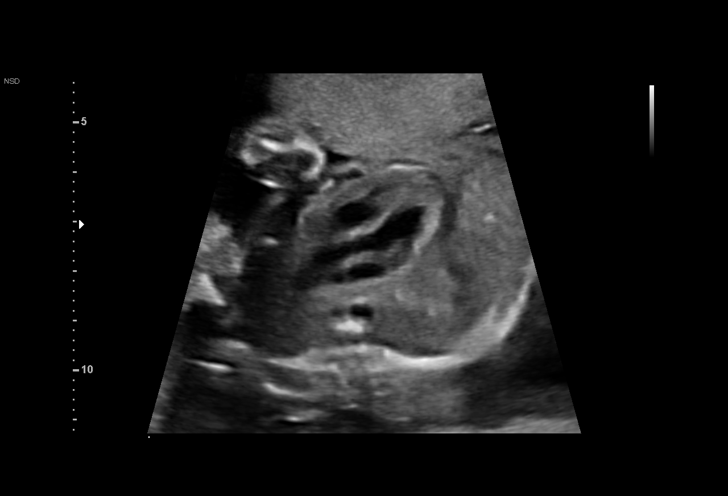
[im 10/50]
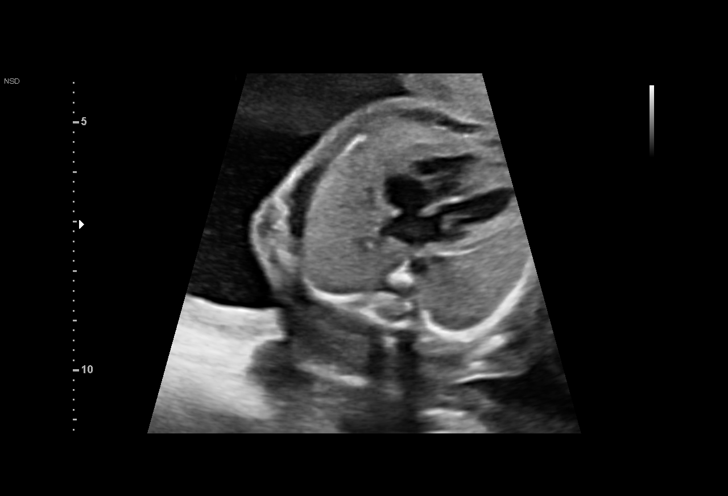
[im 13/50]
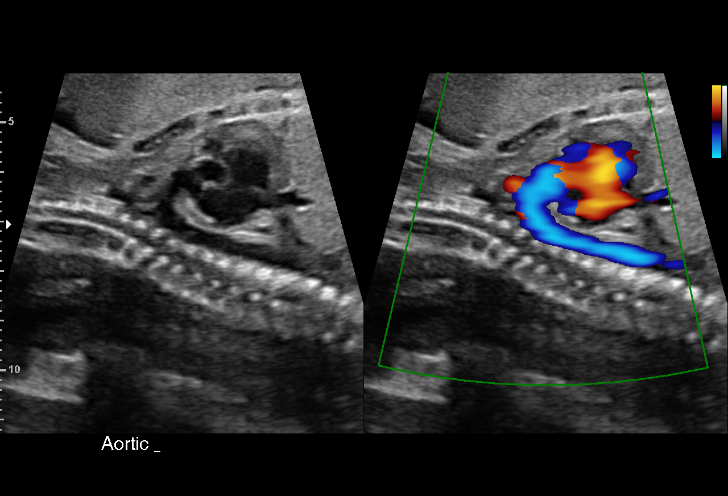
[im 17/50]
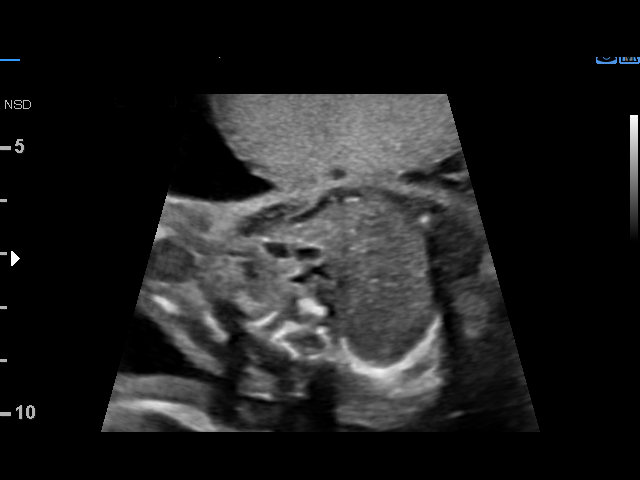
[im 20/50]
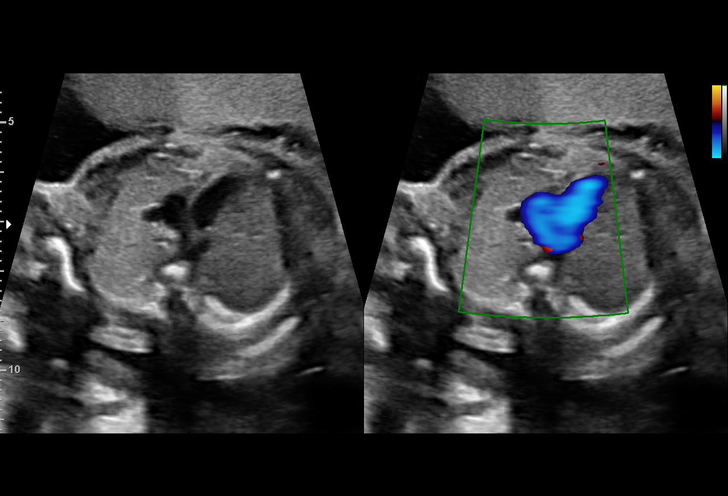
[im 26/50]
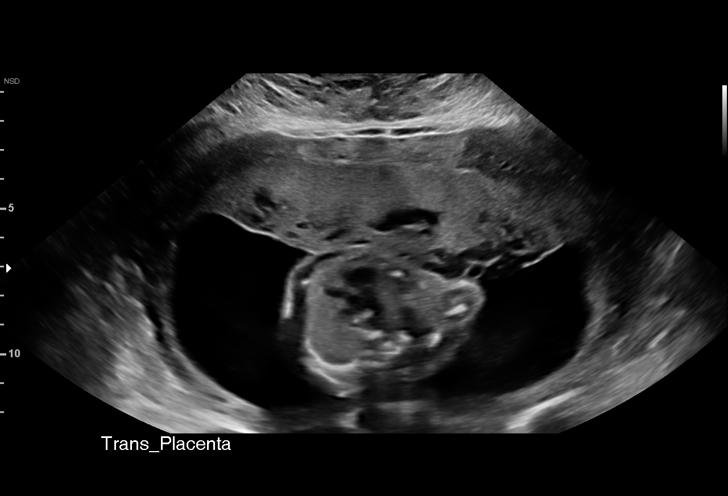
[im 30/50]
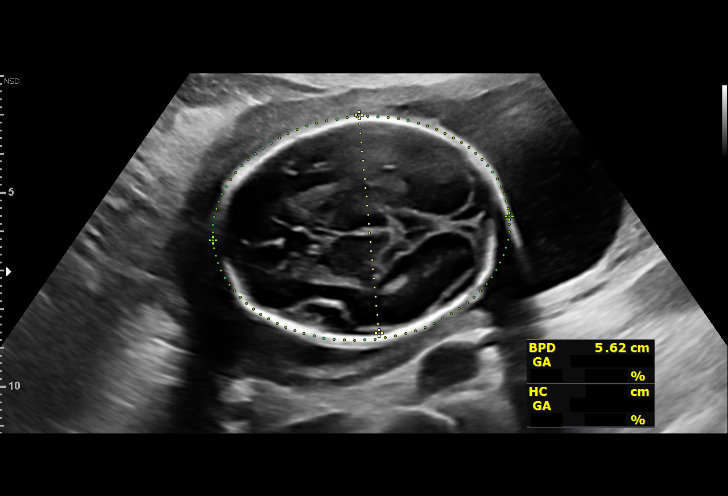
[im 33/50]
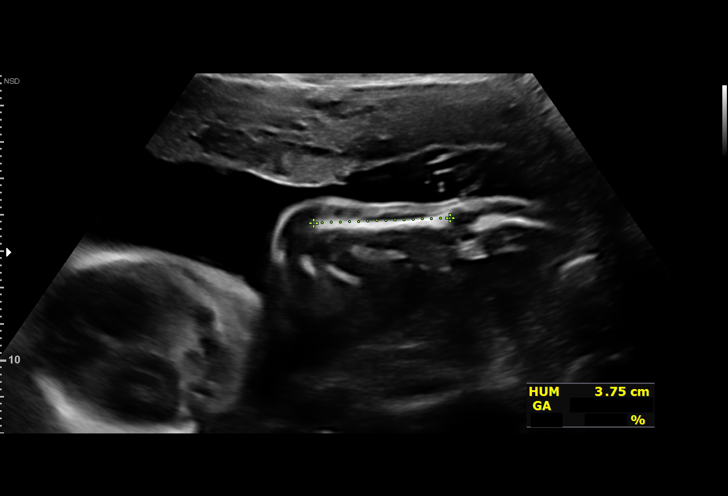
[im 37/50]
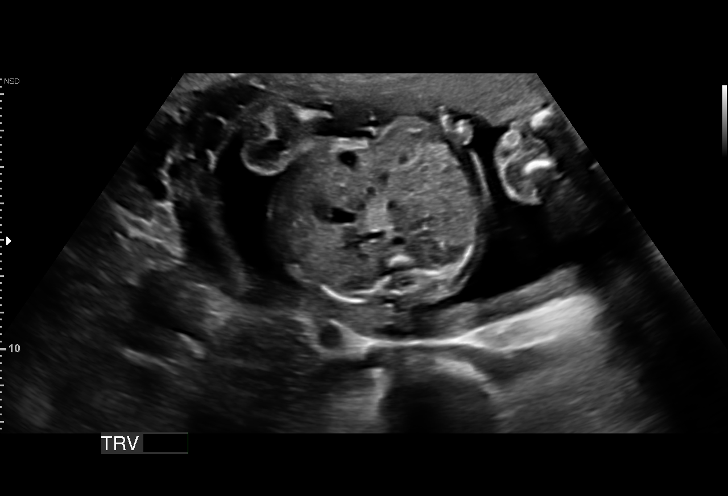
[im 40/50]
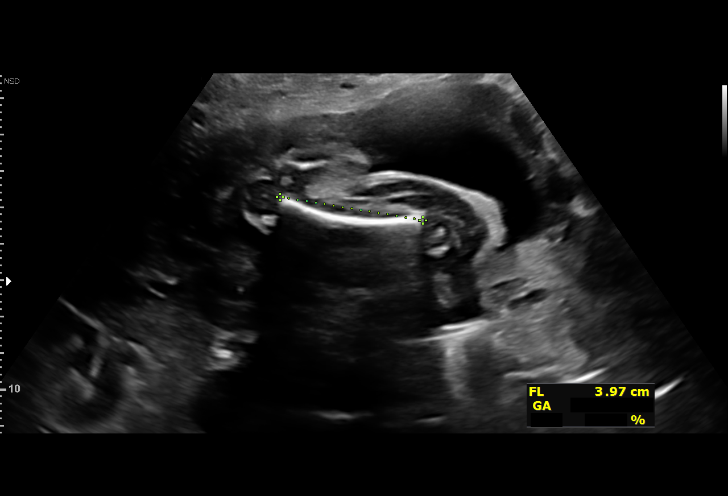
[im 44/50]
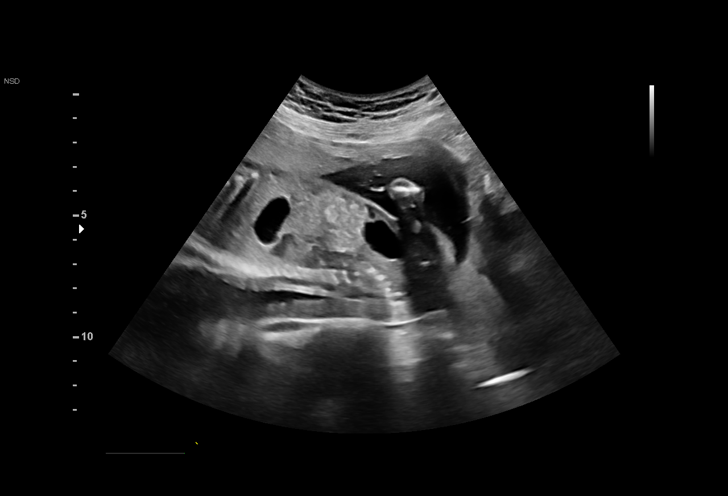
[im 48/50]
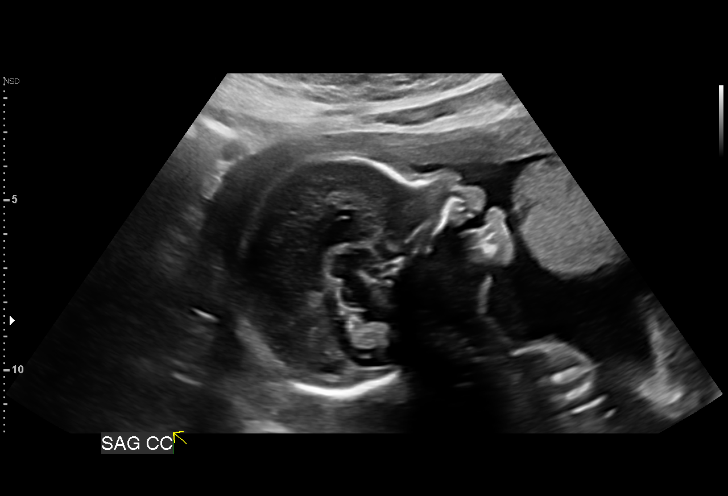

[13 of 28 positions shown; findings below may reference images not displayed]

----------------------------------------------------------------------

 ----------------------------------------------------------------------
Indications

  Obesity complicating pregnancy, second
  trimester (pregravid BMI 42.06)
  23 weeks gestation of pregnancy
  Antenatal follow-up for nonvisualized fetal
  anatomy
 ----------------------------------------------------------------------
Vital Signs

                                                Height:        5'2"
Fetal Evaluation

 Num Of Fetuses:          1
 Fetal Heart Rate(bpm):   146
 Cardiac Activity:        Observed
 Presentation:            Breech
 Placenta:                Anterior
 P. Cord Insertion:       Previously seen as normal

 Amniotic Fluid
 AFI FV:      Within normal limits

                             Largest Pocket(cm)

Biometry

 BPD:      57.1  mm     G. Age:  23w 3d         63  %    CI:        72.44   %    70 - 86
                                                         FL/HC:       18.5  %    19.2 -
 HC:      213.4  mm     G. Age:  23w 3d         51  %    HC/AC:       1.15       1.05 -
 AC:      184.9  mm     G. Age:  23w 2d         51  %    FL/BPD:      69.0  %    71 - 87
 FL:       39.4  mm     G. Age:  22w 5d         29  %    FL/AC:       21.3  %    20 - 24
 HUM:      37.6  mm     G. Age:  23w 1d         46  %
 LV:        4.7  mm

 Est. FW:     561   gm     1 lb 4 oz     55  %
OB History

 Gravidity:    5         Term:   3        Prem:   0        SAB:   1
 TOP:          0       Ectopic:  0        Living: 3
Gestational Age

 U/S Today:     23w 2d                                        EDD:   05/30/19
 Best:          23w 0d     Det. By:  Early Ultrasound         EDD:   06/01/19
                                     (10/13/18)
Anatomy

 Cranium:               Appears normal         Aortic Arch:            Appears normal
 Cavum:                 Previously seen        Ductal Arch:            Appears normal
 Ventricles:            Appears normal         Diaphragm:              Appears normal
 Choroid Plexus:        Previously seen        Stomach:                Appears normal, left
                                                                       sided
 Cerebellum:            Previously seen        Abdomen:                Appears normal
 Posterior Fossa:       Previously seen        Abdominal Wall:         Previously seen
 Nuchal Fold:           Previously seen        Cord Vessels:           Previously seen
 Face:                  Orbits and profile     Kidneys:                Appear normal
                        previously seen
 Lips:                  Previously seen        Bladder:                Appears normal
 Thoracic:              Appears normal         Spine:                  Previously seen
 Heart:                 Appears normal         Upper Extremities:      Previously seen
                        (4CH, axis, and
                        situs)
 RVOT:                  Appears normal         Lower Extremities:      Previously seen
 LVOT:                  Appears normal

 Other:  Female gender. Heels and 5th digit previously visualized.
Cervix Uterus Adnexa

 Cervix
 Length:            3.6  cm.
 Normal appearance by transabdominal scan.
Impression

 Patient returned for completion of fetal anatomy. Amniotic
 fluid is normal and good fetal activity is seen. Fetal growth is
 appropriate for gestational age. Fetal anatomical survey was
 completed and appears normal.

 On cell-free fetal DNA screening, the risks of fetal
 aneuploidies are not increased.

 Maternal obesity imposes limitations on the resolution of
 images, and failure to detect fetal anomalies is more common
 in obese pregnant women. As maternal obesity makes
 clinical assessment of fetal growth difficult, we recommend
 serial growth scans until delivery.
Recommendations

 An appointment was made for her to return in 4 weeks for
 fetal growth assessment.
                 Song, [place]

## 2020-10-03 IMAGING — US US MFM OB FOLLOW UP
1 series · 13 of 28 positions shown · non-contrast
Comparison: none

[Series 1: us mfm ob follow up · 13 of 52 slices shown]
[im 2/52]
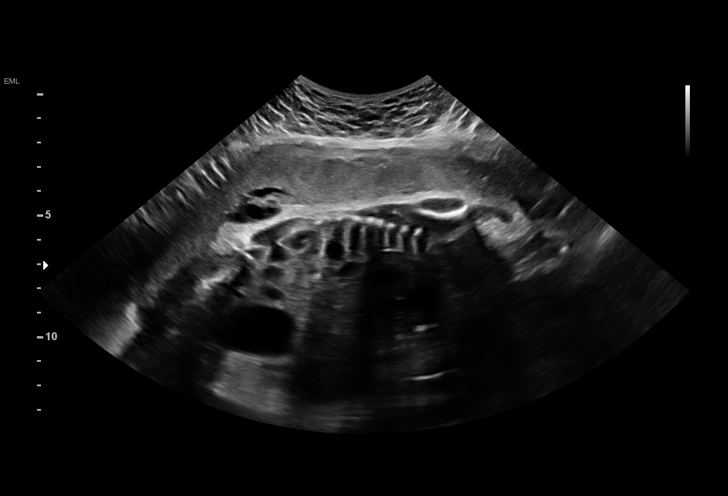
[im 6/52]
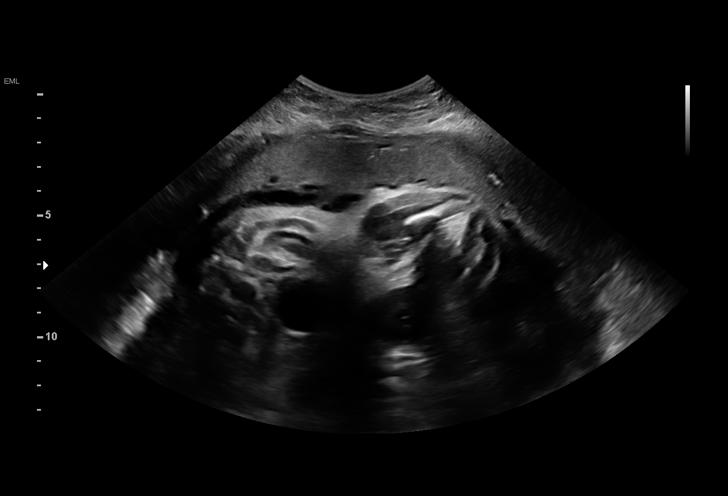
[im 10/52]
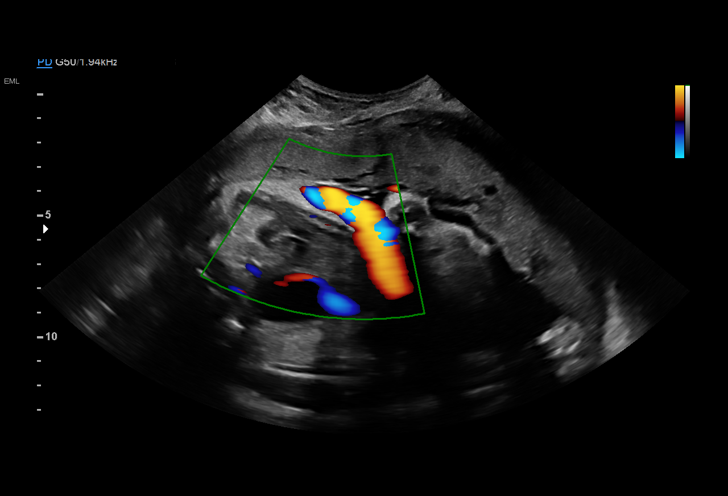
[im 14/52]
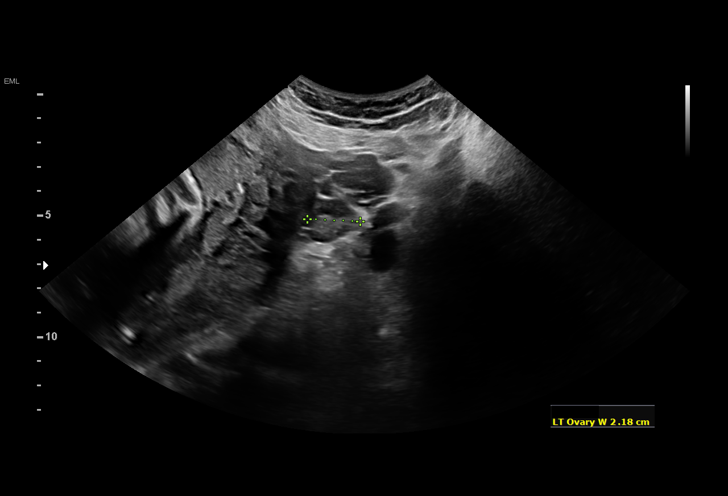
[im 18/52]
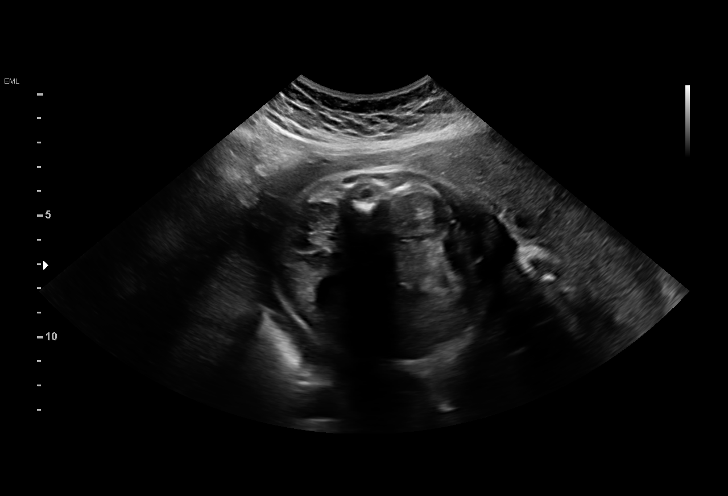
[im 21/52]
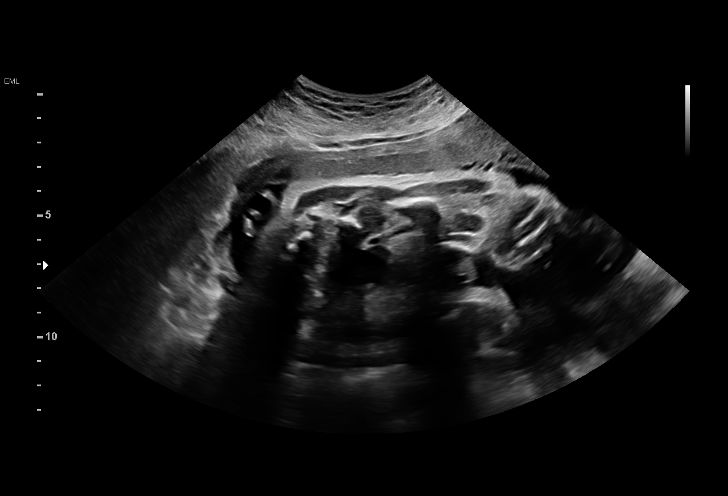
[im 27/52]
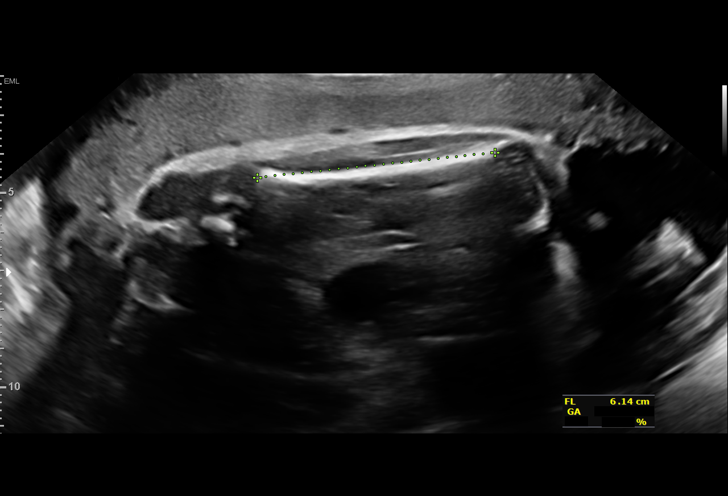
[im 31/52]
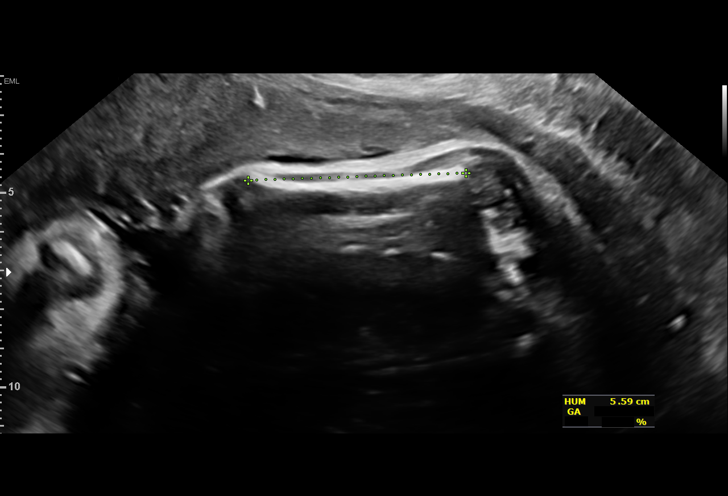
[im 35/52]
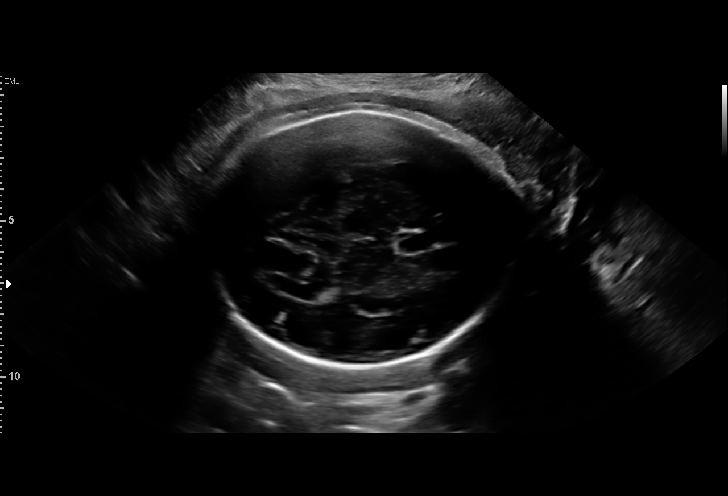
[im 38/52]
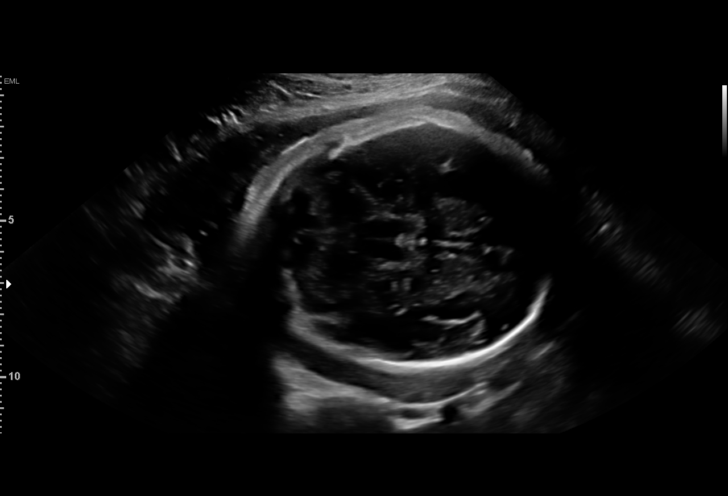
[im 42/52]
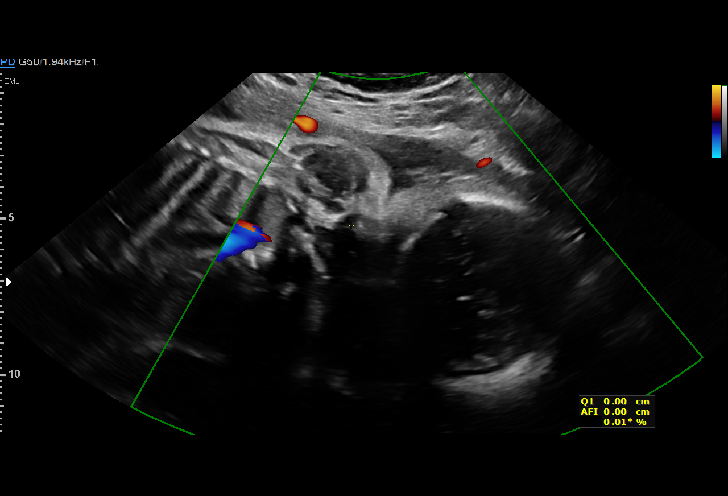
[im 46/52]
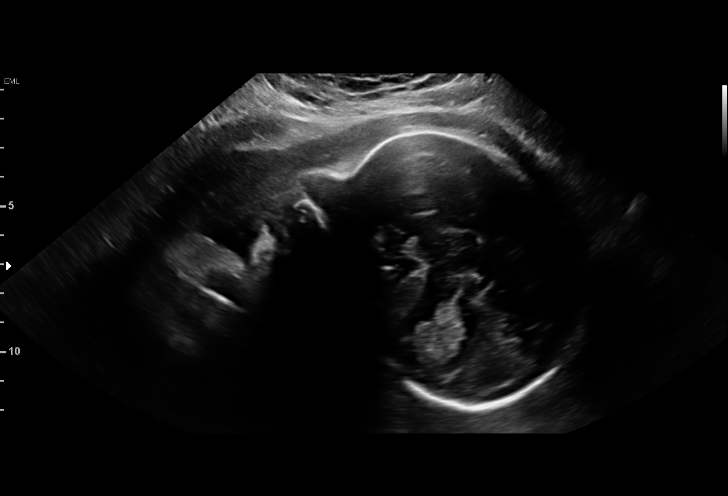
[im 50/52]
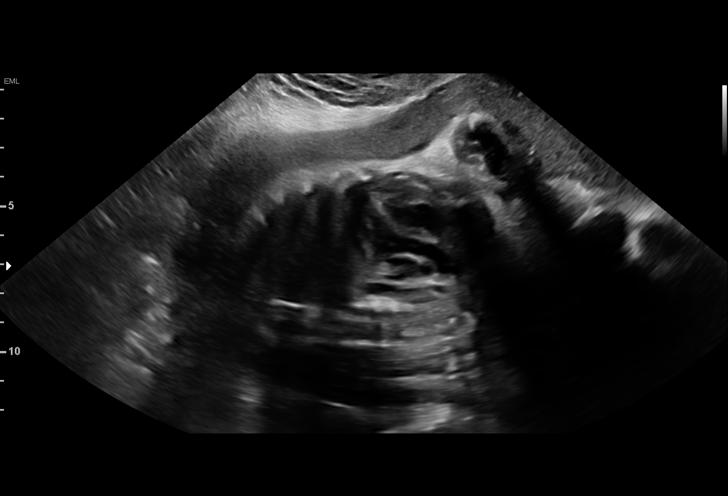

[13 of 28 positions shown; findings below may reference images not displayed]

PREMDA
 ----------------------------------------------------------------------

 ----------------------------------------------------------------------
Indications

  32 weeks gestation of pregnancy
  Obesity complicating pregnancy, second
  trimester (pregravid BMI 42.06)
  Encounter for other antenatal screening
  follow-up
 ----------------------------------------------------------------------
Vital Signs

 BMI:
Fetal Evaluation

 Num Of Fetuses:          1
 Fetal Heart Rate(bpm):   154
 Cardiac Activity:        Observed
 Presentation:            Cephalic
 Placenta:                Anterior
 P. Cord Insertion:       Visualized, central

 Amniotic Fluid
 AFI FV:      Within normal limits

 AFI Sum(cm)     %Tile       Largest Pocket(cm)
 7.05            < 3

 RUQ(cm)       RLQ(cm)       LUQ(cm)        LLQ(cm)
 0             2.18          0
Biometry

 BPD:      81.1  mm     G. Age:  32w 4d         59  %    CI:        77.75   %    70 - 86
                                                         FL/HC:       20.9  %    19.1 -
 HC:      291.1  mm     G. Age:  32w 1d         16  %    HC/AC:       1.02       0.96 -
 AC:      284.8  mm     G. Age:  32w 4d         64  %    FL/BPD:      74.8  %    71 - 87
 FL:       60.7  mm     G. Age:  31w 4d         25  %    FL/AC:       21.3  %    20 - 24
 HUM:      54.9  mm     G. Age:  32w 0d         49  %

 LV:        4.2  mm

 Est. FW:    8071   gm     4 lb 4 oz     45  %
OB History

 Gravidity:    5         Term:   3        Prem:   0        SAB:   1
 TOP:          0       Ectopic:  0        Living: 3
Gestational Age

 U/S Today:     32w 2d                                        EDD:   05/30/19
 Best:          32w 0d     Det. By:  Early Ultrasound         EDD:   06/01/19
                                     (10/13/18)
Anatomy

 Cranium:               Appears normal         LVOT:                   Appears normal
 Cavum:                 Appears normal         Aortic Arch:            Previously seen
 Ventricles:            Appears normal         Ductal Arch:            Previously seen
 Choroid Plexus:        Appears normal         Diaphragm:              Appears normal
 Cerebellum:            Appears normal         Stomach:                Appears normal, left
                                                                       sided
 Posterior Fossa:       Appears normal         Abdomen:                Appears normal
 Nuchal Fold:           Not applicable (>20    Abdominal Wall:         Appears nml (cord
                        wks GA)                                        insert, abd wall)
 Face:                  Orbits and profile     Cord Vessels:           Appears normal (3
                        previously seen                                vessel cord)
 Lips:                  Previously seen        Kidneys:                Appear normal
 Palate:                Previously seen        Bladder:                Appears normal
 Thoracic:              Appears normal         Spine:                  Previously seen
 Heart:                 Appears normal         Upper Extremities:      Previously seen
                        (4CH, axis, and
                        situs)
 RVOT:                  Appears normal         Lower Extremities:      Previously seen

 Other:  Heels and 5th digit previously seen.
Cervix Uterus Adnexa

 Cervix
 Not visualized (advanced GA >11wks)

 Left Ovary
 Within normal limits.

 Right Ovary
 Within normal limits.

 Adnexa
 No abnormality visualized.
Comments

 Normal fetal growth assessment. AFI normal.
Impression

 Normal interval growth.
 BMI >40
Recommendations

 Follow up growth in 4 weeks.

## 2021-01-06 ENCOUNTER — Other Ambulatory Visit: Payer: Self-pay

## 2021-01-06 ENCOUNTER — Ambulatory Visit (HOSPITAL_COMMUNITY)
Admission: EM | Admit: 2021-01-06 | Discharge: 2021-01-06 | Disposition: A | Discharge status: Home / Self Care | Payer: Medicaid Other | Attending: Sports Medicine | Admitting: Sports Medicine

## 2021-01-06 ENCOUNTER — Encounter (HOSPITAL_COMMUNITY): Payer: Self-pay

## 2021-01-06 DIAGNOSIS — M79645 Pain in left finger(s): Secondary | ICD-10-CM

## 2021-01-06 DIAGNOSIS — L03012 Cellulitis of left finger: Secondary | ICD-10-CM

## 2021-01-06 MED ORDER — SULFAMETHOXAZOLE-TRIMETHOPRIM 800-160 MG PO TABS: 1.0000 | ORAL_TABLET | Freq: Two times a day (BID) | ORAL | 0 refills | Status: AC

## 2021-01-06 NOTE — Discharge Instructions (Addendum)
As we discussed, you have an infection in your finger. I sent in an antibiotic to your pharmacy. I provided you some educational handouts. Since you do not have a primary care physician if your symptoms persist then you need to come back and see [place].  If they worsen you need to go to the ER.

## 2021-01-06 NOTE — ED Triage Notes (Signed)
Pt reports swelling and pain in the left middle finger x 3 days after she pulled out some dry skin around the nail.

## 2021-01-08 NOTE — ED Provider Notes (Signed)
[facility]    CSN: [id number] Arrival date & time: 01/06/21  1623      History   Chief Complaint Chief Complaint  Patient presents with  . finger problem    HPI Alicia Craig is a 30 y.o. female.   Patient is a 30 year old female who presents for evaluation of the above issue.  She does not have a primary care physician.  She works in a hotel.  She reports that she is right-hand dominant and she has had left third finger pain for about 3 days.  She relates it to some skin that she pulled away near the cuticle.  She thinks it was a hangnail.  Since then she is noted worsening pain and swelling especially around the cuticle and into the pulp on the palmar surface of the third finger.  She denies any fever shakes chills, no nausea vomiting or diarrhea.  No streaking of the skin.  She reports no other symptoms.  No red flag signs or symptoms elicited on history.      Past Medical History:  Diagnosis Date  . Chlamydia 2011    Patient Active Problem List   Diagnosis Date Noted  . Indication for care in labor or delivery 05/26/2019  . Vaginal delivery 05/26/2019  . Positive GBS test 05/13/2019  . Gonorrhea affecting pregnancy in third trimester 05/13/2019  . Anemia affecting pregnancy in third trimester 03/18/2019  . Supervision of other normal pregnancy, antepartum 10/04/2018  . Previous child with Down syndrome, antepartum 05/09/2014    Past Surgical History:  Procedure Laterality Date  . DILATION AND CURETTAGE OF UTERUS      OB History    Gravida  5   Para  4   Term  4   Preterm      AB  1   Living  4     SAB  1   IAB      Ectopic      Multiple  0   Live Births  4            Home Medications    Prior to Admission medications   Medication Sig Start Date End Date Taking? Authorizing Provider  etonogestrel (NEXPLANON) 68 MG IMPL implant 1 each by Subdermal route once.   Yes [provider]   sulfamethoxazole-trimethoprim (BACTRIM DS) 800-160 MG tablet Take 1 tablet by mouth 2 (two) times daily for 10 days. 01/06/21 01/16/21 Yes Delton See, MD  ferrous sulfate 325 (65 FE) MG EC tablet Take 1 tablet (325 mg total) by mouth 2 (two) times daily with a meal. 03/18/19   Raelyn Mora, CNM  fluconazole (DIFLUCAN) 150 MG tablet Take 1 tablet (150 mg total) by mouth daily. Take second dose 72 hours later if symptoms still persists. 09/01/19   Yu, Amy V, PA-C  Prenat-Fe Carbonyl-FA-Omega 3 (ONE-A-DAY WOMENS PRENATAL 1) 28-0.8-235 MG CAPS Take 1 capsule by mouth daily. 10/28/18   Sharyon Cable, CNM    Family History Family History  Problem Relation Age of Onset  . Hypertension Mother   . Diabetes Maternal Grandmother   . Down syndrome Daughter     Social History Social History   Tobacco Use  . Smoking status: Never Smoker  . Smokeless tobacco: Never Used  Vaping Use  . Vaping Use: Never used  Substance Use Topics  . Alcohol use: No  . Drug use: No     Allergies   Patient has no known allergies.  Review of Systems Review of Systems  Constitutional: Negative for appetite change, chills, diaphoresis, fatigue and fever.  HENT: Negative for congestion, ear pain, postnasal drip, rhinorrhea, sinus pressure, sinus pain, sneezing and sore throat.   Eyes: Negative for pain.  Respiratory: Negative for cough, chest tightness and shortness of breath.   Cardiovascular: Negative for chest pain and palpitations.  Gastrointestinal: Negative for abdominal pain, diarrhea, nausea and vomiting.  Genitourinary: Negative for dysuria.  Musculoskeletal: Positive for arthralgias. Negative for back pain, myalgias and neck pain.  Skin: Positive for color change and wound. Negative for pallor and rash.  Neurological: Negative for dizziness, light-headedness and headaches.  All other systems reviewed and are negative.    Physical Exam Triage Vital Signs ED Triage Vitals  Enc Vitals  Group     BP 01/06/21 1726 122/83     Pulse Rate 01/06/21 1726 75     Resp 01/06/21 1726 18     Temp 01/06/21 1726 98.6 F (37 C)     Temp Source 01/06/21 1726 Oral     SpO2 01/06/21 1726 98 %     Weight --      Height --      Head Circumference --      Peak Flow --      Pain Score 01/06/21 1724 10     Pain Loc --      Pain Edu? --      Excl. in GC? --    No data found.  Updated Vital Signs BP 122/83 (BP Location: Left Arm)   Pulse 75   Temp 98.6 F (37 C) (Oral)   Resp 18   LMP  (Within Months) Comment: 1 month   SpO2 98%   Visual Acuity Right Eye Distance:   Left Eye Distance:   Bilateral Distance:    Right Eye Near:   Left Eye Near:    Bilateral Near:     Physical Exam Vitals and nursing note reviewed.  Constitutional:      General: She is not in acute distress.    Appearance: Normal appearance. She is not ill-appearing, toxic-appearing or diaphoretic.  HENT:     Head: Normocephalic and atraumatic.     Nose: Nose normal.     Mouth/Throat:     Mouth: Mucous membranes are moist.  Eyes:     Conjunctiva/sclera: Conjunctivae normal.     Pupils: Pupils are equal, round, and reactive to light.  Cardiovascular:     Rate and Rhythm: Normal rate and regular rhythm.     Pulses: Normal pulses.     Heart sounds: Normal heart sounds. No murmur heard. No friction rub. No gallop.   Pulmonary:     Effort: Pulmonary effort is normal.     Breath sounds: Normal breath sounds. No stridor. No wheezing, rhonchi or rales.  Musculoskeletal:     Cervical back: Normal range of motion and neck supple. No rigidity.  Skin:    General: Skin is warm and dry.     Capillary Refill: Capillary refill takes less than 2 seconds.     Findings: Erythema present.     Comments: Left 3rd finger - TTP at the cuticle and into the pulp on the palmar aspect, positive erythema, no fluctuance, no drainage, flexor and extensor tendon intact, no laxity  Neurological:     General: No focal deficit  present.     Mental Status: She is alert and oriented to person, place, and time.  UC Treatments / Results  Labs (all labs ordered are listed, but only abnormal results are displayed) Labs Reviewed - No data to display  EKG   Radiology No results found.  Procedures Procedures (including critical care time)  Medications Ordered in UC Medications - No data to display  Initial Impression / Assessment and Plan / UC Course  I have reviewed the triage vital signs and the nursing notes.  Pertinent labs & imaging results that were available during my care of the patient were reviewed by me and considered in my medical decision making (see chart for details).  Clinical impression: Paronychia of the left third finger with an early felon of the same finger with resulting swelling and pain.  Treatment plan: 1.  The findings and treatment plan were discussed in detail with the patient.  Patient was in agreement. 2.  I explained the patient that there is no infection in the finger and working to go ahead and treat it with Bactrim DS for 10 days.  Send it in the pharmacy. 3.  Educational handouts provided. 4.  Tylenol or Motrin for any discomfort. 5.  If symptoms worsen or she develops any fever then I have advised her to go to the ER. 6.  If an abscess forms that could potentially be drained I have asked her to come back here and be reevaluated. 7.  Put in a referral to assist her with obtaining a PCP. 8.  Offered a work note but she said she did not need 1. 9.  She was discharged from care in stable condition and she will follow-up here as needed.    Final Clinical Impressions(s) / UC Diagnoses   Final diagnoses:  Felon of finger of left hand  Paronychia of finger of left hand  Pain in left finger(s)     Discharge Instructions     As we discussed, you have an infection in your finger. I sent in an antibiotic to your pharmacy. I provided you some educational  handouts. Since you do not have a primary care physician if your symptoms persist then you need to come back and see [place].  If they worsen you need to go to the ER.    ED Prescriptions    Medication Sig Dispense Auth. Provider   sulfamethoxazole-trimethoprim (BACTRIM DS) 800-160 MG tablet Take 1 tablet by mouth 2 (two) times daily for 10 days. 20 tablet Delton See, MD     PDMP not reviewed this encounter.   Delton See, MD 01/08/21 [id number]

## 2021-01-11 ENCOUNTER — Telehealth (HOSPITAL_BASED_OUTPATIENT_CLINIC_OR_DEPARTMENT_OTHER): Payer: Self-pay

## 2021-01-11 NOTE — Telephone Encounter (Signed)
LVM to CB for NP Appt w/ PCP

## 2021-08-28 ENCOUNTER — Ambulatory Visit: Payer: Self-pay | Admitting: Nurse Practitioner

## 2021-10-16 ENCOUNTER — Ambulatory Visit: Payer: Medicaid Other | Admitting: Nurse Practitioner

## 2021-11-10 ENCOUNTER — Telehealth: Payer: Medicaid Other | Admitting: Nurse Practitioner

## 2021-11-12 ENCOUNTER — Ambulatory Visit (HOSPITAL_COMMUNITY)
Admission: EM | Admit: 2021-11-12 | Discharge: 2021-11-12 | Disposition: A | Discharge status: Home / Self Care | Payer: Medicaid Other | Attending: Nurse Practitioner | Admitting: Nurse Practitioner

## 2021-11-12 ENCOUNTER — Encounter (HOSPITAL_COMMUNITY): Payer: Self-pay

## 2021-11-12 DIAGNOSIS — H9202 Otalgia, left ear: Secondary | ICD-10-CM | POA: Diagnosis not present

## 2021-11-12 DIAGNOSIS — H6982 Other specified disorders of Eustachian tube, left ear: Secondary | ICD-10-CM | POA: Diagnosis not present

## 2021-11-12 MED ORDER — IBUPROFEN 800 MG PO TABS: 800.0000 mg | ORAL_TABLET | Freq: Three times a day (TID) | ORAL | 0 refills | Status: AC | PRN

## 2021-11-12 MED ORDER — CETIRIZINE HCL 10 MG PO TABS: 10.0000 mg | ORAL_TABLET | Freq: Every day | ORAL | 0 refills | Status: AC

## 2021-11-12 MED ORDER — FLUTICASONE PROPIONATE 50 MCG/ACT NA SUSP: 2.0000 | Freq: Every day | NASAL | 0 refills | Status: DC

## 2021-11-12 NOTE — Discharge Instructions (Signed)
Based on your examination, you do not have an ear infection.  As discussed, you have fluid behind the left ear. ?Take medication as prescribed. ?Warm compresses to the affected ear to help with pain or discomfort. ?Follow-up if symptoms do not improve. ?

## 2021-11-12 NOTE — ED Provider Notes (Signed)
?[facility] ? ? ? ?CSN: [id number] ?Arrival date & time: 11/12/21  0844 ? ? ?  ? ?History   ?Chief Complaint ?Chief Complaint  ?Patient presents with  ? Otalgia  ? ? ?HPI ?Alicia Craig is a 31 y.o. female.  ? ?The patient is a 31 year old female who presents with pain in the left ear.  Symptoms have been present for the past 4 days.  She states over the past 24 hours, her symptoms have worsened, she complains of headache on the left side, nasal congestion, constant ear pain and pain that radiates down into her neck.  She denies fever, chills, sore throat, cough or GI symptoms.  She states that her hearing sounds muffled and she is hearing cracking and popping noises in her ear.  Also states that she hears a sweat "whooshing sound" when she lays on her side.  She states that she has been taking Excedrin Migraine for the headache with minimal relief.  She denies any other past medical history. ? ?The history is provided by the patient.  ?Otalgia ?Associated symptoms: congestion, headaches and rhinorrhea   ?Associated symptoms: no ear discharge and no sore throat   ? ?Past Medical History:  ?Diagnosis Date  ? Chlamydia 2011  ? ? ?Patient Active Problem List  ? Diagnosis Date Noted  ? Indication for care in labor or delivery 05/26/2019  ? Vaginal delivery 05/26/2019  ? Positive GBS test 05/13/2019  ? Gonorrhea affecting pregnancy in third trimester 05/13/2019  ? Anemia affecting pregnancy in third trimester 03/18/2019  ? Supervision of other normal pregnancy, antepartum 10/04/2018  ? Previous child with Down syndrome, antepartum 05/09/2014  ? ? ?Past Surgical History:  ?Procedure Laterality Date  ? DILATION AND CURETTAGE OF UTERUS    ? ? ?OB History   ? ? Gravida  ?5  ? Para  ?4  ? Term  ?4  ? Preterm  ?   ? AB  ?1  ? Living  ?4  ?  ? ? SAB  ?1  ? IAB  ?   ? Ectopic  ?   ? Multiple  ?0  ? Live Births  ?4  ?   ?  ?  ? ? ? ?Home Medications   ? ?Prior to Admission medications   ?Medication Sig Start Date  End Date Taking? Authorizing Provider  ?cetirizine (ZYRTEC) 10 MG tablet Take 1 tablet (10 mg total) by mouth daily. 11/12/21 12/12/21 Yes Tyrease Vandeberg-Warren, Sadie Haber, NP  ?fluticasone (FLONASE) 50 MCG/ACT nasal spray Place 2 sprays into both nostrils daily for 14 days. 11/12/21 11/26/21 Yes Rondi Ivy-Warren, Sadie Haber, NP  ?ibuprofen (ADVIL) 800 MG tablet Take 1 tablet (800 mg total) by mouth every 8 (eight) hours as needed for up to 10 days for moderate pain. 11/12/21 11/22/21 Yes Tayvion Lauder-Warren, Sadie Haber, NP  ?etonogestrel (NEXPLANON) 68 MG IMPL implant 1 each by Subdermal route once.    [provider]  ? ? ?Family History ?Family History  ?Problem Relation Age of Onset  ? Hypertension Mother   ? Diabetes Maternal Grandmother   ? Down syndrome Daughter   ? ? ?Social History ?Social History  ? ?Tobacco Use  ? Smoking status: Never  ? Smokeless tobacco: Never  ?Vaping Use  ? Vaping Use: Never used  ?Substance Use Topics  ? Alcohol use: No  ? Drug use: No  ? ? ? ?Allergies   ?Patient has no known allergies. ? ? ?Review of Systems ?Review of Systems  ?Constitutional:  Negative.   ?HENT:  Positive for congestion, ear pain and rhinorrhea. Negative for ear discharge, sinus pressure, sneezing and sore throat.   ?Eyes: Negative.   ?Respiratory: Negative.    ?Cardiovascular: Negative.   ?Gastrointestinal: Negative.   ?Skin: Negative.   ?Neurological:  Positive for headaches. Negative for dizziness.  ?Psychiatric/Behavioral: Negative.    ? ? ?Physical Exam ?Triage Vital Signs ?ED Triage Vitals [11/12/21 0859]  ?Enc Vitals Group  ?   BP 140/79  ?   Pulse Rate 88  ?   Resp 18  ?   Temp 98.2 ?F (36.8 ?C)  ?   Temp Source Oral  ?   SpO2 98 %  ?   Weight   ?   Height   ?   Head Circumference   ?   Peak Flow   ?   Pain Score 5  ?   Pain Loc   ?   Pain Edu?   ?   Excl. in GC?   ? ?No data found. ? ?Updated Vital Signs ?BP 140/79 (BP Location: Left Arm)   Pulse 88   Temp 98.2 ?F (36.8 ?C) (Oral)   Resp 18   SpO2 98%  ? ?Visual  Acuity ?Right Eye Distance:   ?Left Eye Distance:   ?Bilateral Distance:   ? ?Right Eye Near:   ?Left Eye Near:    ?Bilateral Near:    ? ?Physical Exam ?Vitals reviewed.  ?Constitutional:   ?   General: She is not in acute distress. ?   Appearance: Normal appearance.  ?HENT:  ?   Head: Normocephalic and atraumatic.  ?   Right Ear: Ear canal and external ear normal. A middle ear effusion is present.  ?   Left Ear: Ear canal and external ear normal. Decreased hearing noted. A middle ear effusion is present.  ?   Nose: Congestion and rhinorrhea present.  ?   Mouth/Throat:  ?   Mouth: Mucous membranes are moist.  ?Eyes:  ?   Extraocular Movements: Extraocular movements intact.  ?   Conjunctiva/sclera: Conjunctivae normal.  ?   Pupils: Pupils are equal, round, and reactive to light.  ?Cardiovascular:  ?   Rate and Rhythm: Normal rate and regular rhythm.  ?   Pulses: Normal pulses.  ?   Heart sounds: Normal heart sounds.  ?Pulmonary:  ?   Effort: Pulmonary effort is normal.  ?   Breath sounds: Normal breath sounds.  ?Abdominal:  ?   General: Bowel sounds are normal.  ?   Palpations: Abdomen is soft.  ?   Tenderness: There is no abdominal tenderness.  ?Musculoskeletal:  ?   Cervical back: Normal range of motion and neck supple.  ?Skin: ?   General: Skin is warm and dry.  ?Neurological:  ?   Mental Status: She is alert and oriented to person, place, and time.  ? ? ? ?UC Treatments / Results  ?Labs ?(all labs ordered are listed, but only abnormal results are displayed) ?Labs Reviewed - No data to display ? ?EKG ? ? ?Radiology ?No results found. ? ?Procedures ?Procedures (including critical care time) ? ?Medications Ordered in UC ?Medications - No data to display ? ?Initial Impression / Assessment and Plan / UC Course  ?I have reviewed the triage vital signs and the nursing notes. ? ?Pertinent labs & imaging results that were available during my care of the patient were reviewed by me and considered in my medical decision  making (see chart for  details). ? ?The patient presents with left ear pain x 4 days. Symptoms worsened over the past 24 hours. Complains of "popping and crackling" noises in the ear with a swooshing sound when lying down. Also has nasal congestions. No concern for OM as she has no erythema or bulging of the TM. Also no presence of cerumen in the left ear canal. Will treat with fluticasone and cetirizine. Will also provide Ibuprofen for pain. Patient encouraged to take medication as prescribed. Warm compresses to the affected ear as needed for pain. Discussed next steps with patient if symptoms do not improve with this regimen. Patient verbalizes understanding.  ?Final Clinical Impressions(s) / UC Diagnoses  ? ?Final diagnoses:  ?Left ear pain  ?Eustachian tube dysfunction, left  ? ? ? ?Discharge Instructions   ? ?  ?Based on your examination, you do not have an ear infection.  As discussed, you have fluid behind the left ear. ?Take medication as prescribed. ?Warm compresses to the affected ear to help with pain or discomfort. ?Follow-up if symptoms do not improve. ? ? ? ? ?ED Prescriptions   ? ? Medication Sig Dispense Auth. Provider  ? ibuprofen (ADVIL) 800 MG tablet Take 1 tablet (800 mg total) by mouth every 8 (eight) hours as needed for up to 10 days for moderate pain. 30 tablet Porfiria Heinrich-Warren, Sadie Haber, NP  ? fluticasone (FLONASE) 50 MCG/ACT nasal spray Place 2 sprays into both nostrils daily for 14 days. 16 g Lamount Bankson-Warren, Sadie Haber, NP  ? cetirizine (ZYRTEC) 10 MG tablet Take 1 tablet (10 mg total) by mouth daily. 30 tablet Michaiah Holsopple-Warren, Sadie Haber, NP  ? ?  ? ?PDMP not reviewed this encounter. ?  ?Abran Cantor, NP ?11/12/21 [phone] ? ?

## 2021-11-12 NOTE — ED Triage Notes (Signed)
Pt c/o lt ear pain radiating to neck and head x3 days. ?

## 2022-01-29 ENCOUNTER — Ambulatory Visit: Payer: Medicaid Other | Admitting: Family Medicine

## 2022-04-23 DIAGNOSIS — H5213 Myopia, bilateral: Secondary | ICD-10-CM | POA: Diagnosis not present

## 2022-05-21 ENCOUNTER — Ambulatory Visit: Payer: Medicaid Other | Admitting: Family Medicine

## 2022-08-26 ENCOUNTER — Encounter (HOSPITAL_COMMUNITY): Payer: Self-pay

## 2022-08-26 ENCOUNTER — Emergency Department (HOSPITAL_COMMUNITY)
Admission: EM | Admit: 2022-08-26 | Discharge: 2022-08-26 | Disposition: A | Discharge status: Home / Self Care | Payer: Medicaid Other | Attending: Emergency Medicine | Admitting: Emergency Medicine

## 2022-08-26 ENCOUNTER — Other Ambulatory Visit: Payer: Self-pay

## 2022-08-26 DIAGNOSIS — J101 Influenza due to other identified influenza virus with other respiratory manifestations: Secondary | ICD-10-CM | POA: Insufficient documentation

## 2022-08-26 DIAGNOSIS — J111 Influenza due to unidentified influenza virus with other respiratory manifestations: Secondary | ICD-10-CM | POA: Diagnosis not present

## 2022-08-26 DIAGNOSIS — Z1152 Encounter for screening for COVID-19: Secondary | ICD-10-CM | POA: Insufficient documentation

## 2022-08-26 DIAGNOSIS — M791 Myalgia, unspecified site: Secondary | ICD-10-CM | POA: Diagnosis present

## 2022-08-26 LAB — RESP PANEL BY RT-PCR (RSV, FLU A&B, COVID)  RVPGX2
Influenza A by PCR: NEGATIVE
Influenza B by PCR: NEGATIVE
Resp Syncytial Virus by PCR: NEGATIVE
SARS Coronavirus 2 by RT PCR: NEGATIVE

## 2022-08-26 LAB — GROUP A STREP BY PCR: Group A Strep by PCR: NOT DETECTED

## 2022-08-26 MED ORDER — BENZONATATE 100 MG PO CAPS
200.0000 mg | ORAL_CAPSULE | Freq: Once | ORAL | Status: AC
  Administered 2022-08-26: 200 mg via ORAL
  Filled 2022-08-26: qty 2

## 2022-08-26 MED ORDER — BENZONATATE 100 MG PO CAPS: 100.0000 mg | ORAL_CAPSULE | Freq: Three times a day (TID) | ORAL | 0 refills | Status: DC

## 2022-08-26 MED ORDER — IBUPROFEN 200 MG PO TABS
600.0000 mg | ORAL_TABLET | Freq: Once | ORAL | Status: AC
  Administered 2022-08-26: 600 mg via ORAL
  Filled 2022-08-26: qty 3

## 2022-08-26 NOTE — ED Provider Notes (Signed)
[facility] DEPT Provider Note   CSN: [id number] Arrival date & time: 08/26/22  1743     History Chief Complaint  Patient presents with   Generalized Body Aches    Alicia Craig is a 32 y.o. female.  HPI Patient presents to the emergency complaints of generalized bodyaches.  She reports that she has been sick for about 1 week.  Daughter has recently been sick at home as well.  Patient also reports associated cough, sore throat, headaches, chills.  Patient denies any chest pain, shortness of breath, abdominal pain, nausea, vomiting, diarrhea.    Home Medications Prior to Admission medications   Medication Sig Start Date End Date Taking? Authorizing Provider  benzonatate (TESSALON) 100 MG capsule Take 1 capsule (100 mg total) by mouth every 8 (eight) hours. 08/26/22  Yes Luvenia Heller, PA-C  cetirizine (ZYRTEC) 10 MG tablet Take 1 tablet (10 mg total) by mouth daily. 11/12/21 12/12/21  Leath-Warren, Alda Lea, NP  etonogestrel (NEXPLANON) 68 MG IMPL implant 1 each by Subdermal route once.    [provider]  fluticasone (FLONASE) 50 MCG/ACT nasal spray Place 2 sprays into both nostrils daily for 14 days. 11/12/21 11/26/21  Leath-Warren, Alda Lea, NP      Allergies    Patient has no known allergies.    Review of Systems   Review of Systems  Constitutional:  Positive for chills. Negative for fever.  HENT:  Positive for ear pain.   Respiratory:  Positive for cough. Negative for chest tightness, shortness of breath and wheezing.   Cardiovascular:  Negative for chest pain.  Genitourinary:  Negative for dysuria.  Musculoskeletal:  Positive for myalgias.  Neurological:  Negative for weakness.  All other systems reviewed and are negative.   Physical Exam Updated Vital Signs BP 136/80   Pulse 99   Temp 98.5 F (36.9 C) (Oral)   Resp 16   Ht 5\' 3"  (1.6 m)   Wt 105.7 kg   SpO2 100%   BMI 41.27 kg/m  Physical Exam Vitals and  nursing note reviewed.  Constitutional:      Appearance: Normal appearance.  HENT:     Head: Normocephalic and atraumatic.     Right Ear: Tympanic membrane normal.     Left Ear: Tympanic membrane normal.     Nose: Nose normal.     Mouth/Throat:     Mouth: Mucous membranes are moist.     Pharynx: Posterior oropharyngeal erythema present.  Eyes:     Conjunctiva/sclera: Conjunctivae normal.  Cardiovascular:     Rate and Rhythm: Normal rate and regular rhythm.     Pulses: Normal pulses.     Heart sounds: Normal heart sounds.  Pulmonary:     Effort: Pulmonary effort is normal. No respiratory distress.     Breath sounds: Normal breath sounds. No wheezing.  Abdominal:     General: Abdomen is flat.  Skin:    General: Skin is warm and dry.     Capillary Refill: Capillary refill takes less than 2 seconds.     Coloration: Skin is not jaundiced.  Neurological:     General: No focal deficit present.     Mental Status: She is alert.     ED Results / Procedures / Treatments   Labs (all labs ordered are listed, but only abnormal results are displayed) Labs Reviewed  RESP PANEL BY RT-PCR (RSV, FLU A&B, COVID)  [id number]  GROUP A STREP BY PCR    EKG  None  Radiology No results found.  Procedures Procedures   Medications Ordered in ED Medications  ibuprofen (ADVIL) tablet 600 mg (600 mg Oral Given 08/26/22 1809)  benzonatate (TESSALON) capsule 200 mg (200 mg Oral Given 08/26/22 1810)    ED Course/ Medical Decision Making/ A&P                           Medical Decision Making Risk OTC drugs. Prescription drug management.   This patient presents to the ED for concern of generalized body aches.  Differential diagnosis includes influenza, viral URI, bronchitis   Lab Tests:  I Ordered, and personally interpreted labs.  The pertinent results include: Pending respiratory viral panel and strep   Medicines ordered and prescription drug management:  I ordered medication including  Tessalon and ibuprofen for cough and bodyaches Reevaluation of the patient after these medicines showed that the patient improved I have reviewed the patients home medicines and have made adjustments as needed   Problem List / ED Course:  Patient presented emergency room with complaints of generalized bodyaches that been present for approximately 1 week after exposure to her daughter has been sick with similar symptoms.  Patient endorses headaches, sore throat, cough, generalized malaise.  Patient denies any chest pain, shortness of breath, nausea, vomiting, diarrhea.  Based on presentation respiratory viral panel and strep test were performed as these are the most likely etiology based on exposure to a recently sick dividual.  Advised patient that she can wait for results at home and if needed, antibiotic can be sent to her pharmacy if she were to test positive for strep.  Patient was agreeable with this plan and will plan on waiting at home for results.  Patient have any further questions at the end of this assessment.   Final Clinical Impression(s) / ED Diagnoses Final diagnoses:  Influenza-like illness    Rx / DC Orders ED Discharge Orders          Ordered    benzonatate (TESSALON) 100 MG capsule  Every 8 hours        08/26/22 1758              Smitty Knudsen, PA-C 08/26/22 1817    Glyn Ade, MD 08/29/22 1502

## 2022-08-26 NOTE — Discharge Instructions (Signed)
You were seen in the emergency department today for influenza-like illness.  Based on your symptoms as well as exposure to a child with similar symptoms, it is likely that you have some kind of viral respiratory infection or possibly strep throat.  These tests are currently in process and will result in the next 1 to 2 hours.  You should be able to see these results on MyChart.  If you happen to test positive for strep, an antibiotic will be sent to your pharmacy and you should ensure that you take this medication for its full duration.  For viral causes such as influenza, RSV or COVID, you should manage symptoms at home with medication such as Tylenol or ibuprofen as tolerated.

## 2022-08-26 NOTE — ED Triage Notes (Signed)
Patient has had generalized body aches, headache, sore throat since Tuesday. Daughter sick.

## 2022-08-28 ENCOUNTER — Telehealth: Payer: Self-pay | Admitting: *Deleted

## 2022-08-28 NOTE — Patient Outreach (Signed)
  Care Coordination [facility] Note Transition Care Management Follow-up Telephone Call Date of discharge and from where: 08/26/22 from [facility] How have you been since you were released from the hospital? Patient reports doing better Any questions or concerns? No  Items Reviewed: Did the pt receive and understand the discharge instructions provided? Yes  Medications obtained and verified? Yes  Other? No  Any new allergies since your discharge? No  Dietary orders reviewed? No Do you have support at home? Yes   Home Care and Equipment/Supplies: Were home health services ordered? no If so, what is the name of the agency? N/A  Has the agency set up a time to come to the patient's home? not applicable Were any new equipment or medical supplies ordered?  No What is the name of the medical supply agency? N/A Were you able to get the supplies/equipment? not applicable Do you have any questions related to the use of the equipment or supplies? No  Functional Questionnaire: (I = Independent and D = Dependent) ADLs: I  Bathing/Dressing- I  Meal Prep- I  Eating- I  Maintaining continence- I  Transferring/Ambulation- I  Managing Meds- I  Follow up appointments reviewed:  PCP Hospital f/u appt confirmed? Yes  Scheduled to see new PCP on 09/19/22 . [facility] f/u appt confirmed?  N/A, ED visit . Are transportation arrangements needed? Yes Discussed transportation provided by [facility], encouraged patient to call today for appointment on 09/19/22 If their condition worsens, is the pt aware to call PCP or go to the Emergency Dept.? Yes Was the patient provided with contact information for the PCP's office or ED? No Was to pt encouraged to call back with questions or concerns? Yes  SDOH assessments and interventions completed:   Yes

## 2022-09-07 ENCOUNTER — Emergency Department (HOSPITAL_COMMUNITY): Payer: Medicaid Other

## 2022-09-07 ENCOUNTER — Emergency Department (HOSPITAL_COMMUNITY)
Admission: EM | Admit: 2022-09-07 | Discharge: 2022-09-07 | Disposition: A | Discharge status: Home / Self Care | Payer: Medicaid Other | Attending: Emergency Medicine | Admitting: Emergency Medicine

## 2022-09-07 DIAGNOSIS — R070 Pain in throat: Secondary | ICD-10-CM | POA: Diagnosis present

## 2022-09-07 DIAGNOSIS — J039 Acute tonsillitis, unspecified: Secondary | ICD-10-CM | POA: Diagnosis not present

## 2022-09-07 DIAGNOSIS — J351 Hypertrophy of tonsils: Secondary | ICD-10-CM | POA: Diagnosis not present

## 2022-09-07 DIAGNOSIS — J36 Peritonsillar abscess: Secondary | ICD-10-CM | POA: Diagnosis not present

## 2022-09-07 LAB — BASIC METABOLIC PANEL
Anion gap: 8 (ref 5–15)
BUN: 7 mg/dL (ref 6–20)
CO2: 24 mmol/L (ref 22–32)
Calcium: 8.9 mg/dL (ref 8.9–10.3)
Chloride: 102 mmol/L (ref 98–111)
Creatinine, Ser: 0.69 mg/dL (ref 0.44–1.00)
GFR, Estimated: >60 mL/min (ref >60)
Glucose, Bld: 85 mg/dL (ref 70–99)
Potassium: 3.4 mmol/L — ABNORMAL LOW (ref 3.5–5.1)
Sodium: 134 mmol/L — ABNORMAL LOW (ref 135–145)

## 2022-09-07 LAB — CBC WITH DIFFERENTIAL/PLATELET
Abs Immature Granulocytes: 0.04 10*3/uL (ref 0.00–0.07)
Basophils Absolute: 0 10*3/uL (ref 0.0–0.1)
Basophils Relative: 0 %
Eosinophils Absolute: 0 10*3/uL (ref 0.0–0.5)
Eosinophils Relative: 0 %
HCT: 36.2 % (ref 36.0–46.0)
Hemoglobin: 10.8 g/dL — ABNORMAL LOW (ref 12.0–15.0)
Immature Granulocytes: 0 %
Lymphocytes Relative: 13 %
Lymphs Abs: 1.3 10*3/uL (ref 0.7–4.0)
MCH: 26.2 pg (ref 26.0–34.0)
MCHC: 29.8 g/dL — ABNORMAL LOW (ref 30.0–36.0)
MCV: 87.7 fL (ref 80.0–100.0)
Monocytes Absolute: 0.4 10*3/uL (ref 0.1–1.0)
Monocytes Relative: 4 %
Neutro Abs: 8.3 10*3/uL — ABNORMAL HIGH (ref 1.7–7.7)
Neutrophils Relative %: 83 %
Platelets: 296 10*3/uL (ref 150–400)
RBC: 4.13 MIL/uL (ref 3.87–5.11)
RDW: 13.6 % (ref 11.5–15.5)
WBC: 10.1 10*3/uL (ref 4.0–10.5)
nRBC: 0 % (ref 0.0–0.2)

## 2022-09-07 LAB — I-STAT BETA HCG BLOOD, ED (MC, WL, AP ONLY): I-stat hCG, quantitative: <5 m[IU]/mL (ref <5)

## 2022-09-07 LAB — GROUP A STREP BY PCR: Group A Strep by PCR: NOT DETECTED

## 2022-09-07 MED ORDER — KETOROLAC TROMETHAMINE 15 MG/ML IJ SOLN
15.0000 mg | Freq: Once | INTRAMUSCULAR | Status: AC
  Administered 2022-09-07: 15 mg via INTRAVENOUS
  Filled 2022-09-07: qty 1

## 2022-09-07 MED ORDER — IOHEXOL 300 MG/ML  SOLN
75.0000 mL | Freq: Once | INTRAMUSCULAR | Status: AC | PRN
  Administered 2022-09-07: 75 mL via INTRAVENOUS

## 2022-09-07 MED ORDER — DEXAMETHASONE SODIUM PHOSPHATE 10 MG/ML IJ SOLN
10.0000 mg | Freq: Once | INTRAMUSCULAR | Status: AC
  Administered 2022-09-07: 10 mg via INTRAVENOUS
  Filled 2022-09-07: qty 1

## 2022-09-07 MED ORDER — AMOXICILLIN-POT CLAVULANATE 875-125 MG PO TABS: 1.0000 | ORAL_TABLET | Freq: Two times a day (BID) | ORAL | 0 refills | Status: DC

## 2022-09-07 MED ORDER — PIPERACILLIN-TAZOBACTAM 3.375 G IVPB 30 MIN
3.3750 g | Freq: Once | INTRAVENOUS | Status: AC
  Administered 2022-09-07: 3.375 g via INTRAVENOUS
  Filled 2022-09-07: qty 50

## 2022-09-07 NOTE — ED Provider Notes (Signed)
[facility] EMERGENCY DEPARTMENT AT [facility] Provider Note   CSN: [id number] Arrival date & time: 09/07/22  1533     History  Chief Complaint  Patient presents with   Sore Throat    Alicia Craig is a 32 y.o. female with no significant past medical history who presents to the ED from urgent care due to concern about a peritonsillar abscess.  Patient admits to persistent sore throat for the past 1 to 2 weeks.  She notes pain is worse on the left side.  Admits to pain worse with swallowing.  Denies trismus, changes to phonation, and difficulty swallowing.  No fever or chills.  Patient has 4 children at home.  None sick with similar symptoms.  Denies any vaginal symptoms.  No concern for STIs. Denies cough, nasal congestion, SOB, CP.   History obtained from patient and past medical records. No interpreter used during encounter.       Home Medications Prior to Admission medications   Medication Sig Start Date End Date Taking? Authorizing Provider  amoxicillin-clavulanate (AUGMENTIN) 875-125 MG tablet Take 1 tablet by mouth every 12 (twelve) hours. 09/07/22  Yes Cynthis Purington, Merla Riches, PA-C  benzonatate (TESSALON) 100 MG capsule Take 1 capsule (100 mg total) by mouth every 8 (eight) hours. 08/26/22   Smitty Knudsen, PA-C  cetirizine (ZYRTEC) 10 MG tablet Take 1 tablet (10 mg total) by mouth daily. 11/12/21 12/12/21  Leath-Warren, Sadie Haber, NP  etonogestrel (NEXPLANON) 68 MG IMPL implant 1 each by Subdermal route once.    [provider]  fluticasone (FLONASE) 50 MCG/ACT nasal spray Place 2 sprays into both nostrils daily for 14 days. 11/12/21 11/26/21  Leath-Warren, Sadie Haber, NP  ibuprofen (ADVIL) 200 MG tablet Take 200 mg by mouth every 6 (six) hours as needed.    [provider]      Allergies    Patient has no known allergies.    Review of Systems   Review of Systems  Constitutional:  Negative for chills and fever.  HENT:  Positive for sore throat.  Negative for trouble swallowing and voice change.   Respiratory:  Negative for cough and shortness of breath.   Cardiovascular:  Negative for chest pain.  Gastrointestinal:  Negative for abdominal pain.  All other systems reviewed and are negative.   Physical Exam Updated Vital Signs BP (!) 155/80 (BP Location: Right Arm)   Pulse (!) 105   Temp 98.2 F (36.8 C) (Oral)   Resp 17   SpO2 100%  Physical Exam Vitals and nursing note reviewed.  Constitutional:      General: She is not in acute distress.    Appearance: She is not ill-appearing.  HENT:     Head: Normocephalic.     Mouth/Throat:     Comments: Left tonsillary hypertrophy. No trismus.  Tolerating oral secretions without difficulty. Normal phonation. Airway patent.  Eyes:     Pupils: Pupils are equal, round, and reactive to light.  Cardiovascular:     Rate and Rhythm: Normal rate and regular rhythm.     Pulses: Normal pulses.     Heart sounds: Normal heart sounds. No murmur heard.    No friction rub. No gallop.  Pulmonary:     Effort: Pulmonary effort is normal.     Breath sounds: Normal breath sounds.  Abdominal:     General: Abdomen is flat. There is no distension.     Palpations: Abdomen is soft.     Tenderness: There  is no abdominal tenderness. There is no guarding or rebound.  Musculoskeletal:        General: Normal range of motion.     Cervical back: Neck supple.  Skin:    General: Skin is warm and dry.  Neurological:     General: No focal deficit present.     Mental Status: She is alert.  Psychiatric:        Mood and Affect: Mood normal.        Behavior: Behavior normal.     ED Results / Procedures / Treatments   Labs (all labs ordered are listed, but only abnormal results are displayed) Labs Reviewed  CBC WITH DIFFERENTIAL/PLATELET - Abnormal; Notable for the following components:      Result Value   Hemoglobin 10.8 (*)    MCHC 29.8 (*)    Neutro Abs 8.3 (*)    All other components within  normal limits  BASIC METABOLIC PANEL - Abnormal; Notable for the following components:   Sodium 134 (*)    Potassium 3.4 (*)    All other components within normal limits  GROUP A STREP BY PCR  I-STAT BETA HCG BLOOD, ED ([facility], [facility], [facility] ONLY)  GC/CHLAMYDIA PROBE AMP ([place]) NOT AT [facility]    EKG None  Radiology CT Soft Tissue Neck W Contrast  Result Date: 09/07/2022 CLINICAL DATA:  Soft tissue infection suspected, neck, xray done EXAM: CT NECK WITH CONTRAST TECHNIQUE: Multidetector CT imaging of the neck was performed using the standard protocol following the bolus administration of intravenous contrast. RADIATION DOSE REDUCTION: This exam was performed according to the departmental dose-optimization program which includes automated exposure control, adjustment of the mA and/or kV according to patient size and/or use of iterative reconstruction technique. CONTRAST:  57mL OMNIPAQUE IOHEXOL 300 MG/ML  SOLN COMPARISON:  None Available. FINDINGS: Pharynx and larynx: Limited evaluation of the oropharynx due to motion artifact. The left palatine tonsil appears asymmetrically enlarged and mildly heterogeneous. No visible discrete, drainable fluid collection. Salivary glands: No inflammation, mass, or stone. Thyroid: Normal. Lymph nodes: Mildly prominent upper cervical chain lymph nodes. Vascular: Nondiagnostic evaluation due to non-arterial timing. Limited intracranial: Negative. Visualized orbits: Negative. Mastoids and visualized paranasal sinuses: Largely clear sinuses. No mastoid effusions. Skeleton: No acute findings on limited assessment. Upper chest: Visualized lung apices are clear. IMPRESSION: 1. Asymmetrically enlarged and heterogeneous left palatine tonsil. This most likely represents tonsillitis, although malignancy is not excluded. Recommend correlation with direct inspection. No visible discrete, drainable fluid collection. 2. Mildly prominent upper cervical chain lymph nodes bilaterally,  nonspecific but potentially reactive. Electronically Signed   By: Margaretha Sheffield M.D.   On: 09/07/2022 17:51    Procedures Procedures    Medications Ordered in ED Medications  ketorolac (TORADOL) 15 MG/ML injection 15 mg (has no administration in time range)  dexamethasone (DECADRON) injection 10 mg (10 mg Intravenous Given 09/07/22 1641)  piperacillin-tazobactam (ZOSYN) IVPB 3.375 g (3.375 g Intravenous New Bag/Given 09/07/22 1642)  iohexol (OMNIPAQUE) 300 MG/ML solution 75 mL (75 mLs Intravenous Contrast Given 09/07/22 1708)    ED Course/ Medical Decision Making/ A&P                             Medical Decision Making Amount and/or Complexity of Data Reviewed External Data Reviewed: notes.    Details: Reviewed UC recommendations from patient (paperwork printed out) Labs: ordered. Decision-making details documented in ED Course. Radiology: ordered and independent interpretation performed.  Decision-making details documented in ED Course.  Risk Prescription drug management.   This patient presents to the ED for concern of sore throat, this involves an extensive number of treatment options, and is a complaint that carries with it a high risk of complications and morbidity.  The differential diagnosis includes strep throat, abscess, malignancy, viral process, etc  32 year old female presents to the ED from urgent care due to concerns about left peritonsillar abscess.  Admits to a sore throat for the past 1 to 2 weeks.  No fever or chills.  No trismus or changes to phonation.  Upon arrival, patient tachycardic at 105 with otherwise reassuring vitals. Patient afebrile. Patient in no acute distress. Left sided tonsillar hypertrophy.  No trismus.  Patient tolerating oral secretions without difficulty.  Normal phonation. Patient protecting airway at this time. Will obtain CT neck to evaluate for possible abscess.  Routine labs ordered.  IV Decadron and Zosyn given.  Discussed with Dr. Darl Householder who  agrees with assessment and plan.  CBC reassuring.  No leukocytosis.  Anemia with hemoglobin at 10.8 which appears to be around patient's baseline.  Strep negative.  BMP significant for hypokalemia at 3.4 and hyponatremia 134.  Normal renal function.  Pregnancy test negative.  CT neck personally reviewed which demonstrates asymmetrically enlarged left tonsil likely tonsillitis however, malignancy is not excluded.  No drainable fluid collection.  Prominent upper cervical lymph nodes. Patient made aware of results.  Will treat for tonsillitis with Augmentin. Upon reassessment, patient continues to maintain her airway with no evidence of airway compromise. No stridor or wheeze. No tripoding.  Patient discharged with ENT referral and advised to call to schedule an appointment for further evaluation. Patient stable for discharge. Strict ED precautions discussed with patient. Patient states understanding and agrees to plan. Patient discharged home in no acute distress and stable vitals  No PCP        Final Clinical Impression(s) / ED Diagnoses Final diagnoses:  Tonsillitis    Rx / DC Orders ED Discharge Orders          Ordered    amoxicillin-clavulanate (AUGMENTIN) 875-125 MG tablet  Every 12 hours        09/07/22 1807              Karie Kirks 09/07/22 1814    Drenda Freeze, MD 09/07/22 2218

## 2022-09-07 NOTE — ED Triage Notes (Signed)
Pt referred to ED from UC for sore throat and possible L sided peritonsilar abscess.

## 2022-09-07 NOTE — Discharge Instructions (Addendum)
It was a pleasure taking care of you today.  As discussed, your labs were reassuring.  Your CT scan showed possible tonsillitis.  I have included the number of the ear nose and throat doctor.  Please call to schedule an appointment for further evaluation.  I am sending you home with antibiotics.  Take as prescribed and finish all antibiotics.  Return to the ER for new or worsening symptoms.

## 2022-09-08 LAB — GC/CHLAMYDIA PROBE AMP (~~LOC~~) NOT AT ARMC
Chlamydia: NEGATIVE
Comment: NEGATIVE
Comment: NORMAL
Neisseria Gonorrhea: NEGATIVE

## 2022-09-19 ENCOUNTER — Ambulatory Visit: Payer: Medicaid Other | Admitting: Internal Medicine

## 2022-09-23 ENCOUNTER — Ambulatory Visit: Payer: Medicaid Other | Admitting: Internal Medicine

## 2022-11-06 ENCOUNTER — Ambulatory Visit: Payer: Medicaid Other | Admitting: Family Medicine

## 2022-12-15 ENCOUNTER — Ambulatory Visit: Payer: Medicaid Other | Admitting: Family Medicine

## 2022-12-16 ENCOUNTER — Telehealth: Payer: Self-pay | Admitting: Family Medicine

## 2022-12-16 NOTE — Telephone Encounter (Signed)
Called patient and left a message on mychart to schedule appt. Did not reply.

## 2023-01-08 ENCOUNTER — Ambulatory Visit: Payer: Medicaid Other | Admitting: Family Medicine

## 2023-03-30 ENCOUNTER — Encounter: Payer: Self-pay | Admitting: General Practice

## 2023-03-30 ENCOUNTER — Ambulatory Visit: Payer: Medicaid Other | Admitting: Family Medicine

## 2023-05-06 ENCOUNTER — Telehealth: Payer: Self-pay | Admitting: General Practice

## 2023-05-06 ENCOUNTER — Ambulatory Visit: Payer: Medicaid Other | Admitting: Internal Medicine

## 2023-05-06 NOTE — Telephone Encounter (Signed)
NS lack of transportation. Was told if happens again she can not reschedule. Letter sent.

## 2023-05-07 NOTE — Telephone Encounter (Signed)
Approved 1x reschedule

## 2023-05-15 ENCOUNTER — Ambulatory Visit: Payer: Medicaid Other | Admitting: Internal Medicine

## 2023-05-20 ENCOUNTER — Ambulatory Visit: Payer: Medicaid Other | Admitting: Internal Medicine

## 2023-06-02 ENCOUNTER — Telehealth: Payer: Self-pay

## 2023-06-02 NOTE — Telephone Encounter (Signed)
  Medicaid Managed Care   Unsuccessful Outreach Note  06/02/2023 Name: Alicia Craig MRN: [id number] DOB: 07-Jan-1991  Referred by: Patient, No Pcp Per Reason for referral : No chief complaint on file.   An unsuccessful telephone outreach was attempted today. The patient was referred to the case management team for assistance with care management and care coordination.   Follow Up Plan: The patient will call Alicia Samples Zeah Germano* as advised to [phone].   Nicholes Rough, CMA Care Guide [employer]

## 2023-11-26 ENCOUNTER — Telehealth: Admitting: Physician Assistant

## 2023-11-26 DIAGNOSIS — G43809 Other migraine, not intractable, without status migrainosus: Secondary | ICD-10-CM

## 2023-11-26 DIAGNOSIS — J0110 Acute frontal sinusitis, unspecified: Secondary | ICD-10-CM | POA: Diagnosis not present

## 2023-11-26 MED ORDER — FLUTICASONE PROPIONATE 50 MCG/ACT NA SUSP: 2.0000 | Freq: Every day | NASAL | 0 refills | Status: AC

## 2023-11-26 MED ORDER — PREDNISONE 10 MG (21) PO TBPK: ORAL_TABLET | ORAL | 0 refills | Status: AC

## 2023-11-26 MED ORDER — DOXYCYCLINE HYCLATE 100 MG PO TABS: 100.0000 mg | ORAL_TABLET | Freq: Two times a day (BID) | ORAL | 0 refills | Status: AC

## 2023-11-26 NOTE — Progress Notes (Signed)
 Virtual Visit Consent   GENEE RANN, you are scheduled for a virtual visit with a [facility] provider today. Just as with appointments in the office, your consent must be obtained to participate. Your consent will be active for this visit and any virtual visit you may have with one of our providers in the next 365 days. If you have a MyChart account, a copy of this consent can be sent to you electronically.  As this is a virtual visit, video technology does not allow for your provider to perform a traditional examination. This may limit your provider's ability to fully assess your condition. If your provider identifies any concerns that need to be evaluated in person or the need to arrange testing (such as labs, EKG, etc.), we will make arrangements to do so. Although advances in technology are sophisticated, we cannot ensure that it will always work on either your end or our end. If the connection with a video visit is poor, the visit may have to be switched to a telephone visit. With either a video or telephone visit, we are not always able to ensure that we have a secure connection.  By engaging in this virtual visit, you consent to the provision of healthcare and authorize for your insurance to be billed (if applicable) for the services provided during this visit. Depending on your insurance coverage, you may receive a charge related to this service.  I need to obtain your verbal consent now. Are you willing to proceed with your visit today? ANNALEIGHA WOO has provided verbal consent on 11/26/2023 for a virtual visit (video or telephone). Piedad Climes, [place]  Date: 11/26/2023 11:33 AM   Virtual Visit via Video Note   I, Piedad Climes, connected with  STACI DACK  ([id number], Dec 26, 1990) on 11/26/23 at 10:45 AM EDT by a video-enabled telemedicine application and verified that I am speaking with the correct person using two identifiers.  Location: Patient: Virtual  Visit Location Patient: Home Provider: Virtual Visit Location Provider: Home Office   I discussed the limitations of evaluation and management by telemedicine and the availability of in person appointments. The patient expressed understanding and agreed to proceed.    History of Present Illness: TULANI KIDNEY is a 33 y.o. who identifies as a female who was assigned female at birth, and is being seen today for 3 days of increased nasal congestion, sinus pressure and sinus pain with headache after starting with a cold last week -- chills, aches, mild URI symptoms. Noting a low-grade fever now. Denies chest pain or SOB.   Also noting an ongoing issue with intermittent headaches that are usually unilateral, although sometimes can be bilateral, associated with photophobia and nausea without vomiting. Denies any prior diagnosis of migraines. Will take OTC Tylenol or Ibuprofen which help but headache returns and can last up to a few days.  HPI: HPI  Problems:  Patient Active Problem List   Diagnosis Date Noted   Indication for care in labor or delivery 05/26/2019   Vaginal delivery 05/26/2019   Positive GBS test 05/13/2019   Gonorrhea affecting pregnancy in third trimester 05/13/2019   Anemia affecting pregnancy in third trimester 03/18/2019   Supervision of other normal pregnancy, antepartum 10/04/2018   Previous child with Down syndrome, antepartum 05/09/2014    Allergies: No Known Allergies Medications:  Current Outpatient Medications:    doxycycline (VIBRA-TABS) 100 MG tablet, Take 1 tablet (100 mg total) by mouth 2 (two) times daily., Disp:  20 tablet, Rfl: 0   fluticasone (FLONASE) 50 MCG/ACT nasal spray, Place 2 sprays into both nostrils daily., Disp: 16 g, Rfl: 0   predniSONE (STERAPRED UNI-PAK 21 TAB) 10 MG (21) TBPK tablet, Take following package directions, Disp: 21 tablet, Rfl: 0   cetirizine (ZYRTEC) 10 MG tablet, Take 1 tablet (10 mg total) by mouth daily., Disp: 30 tablet,  Rfl: 0   etonogestrel (NEXPLANON) 68 MG IMPL implant, 1 each by Subdermal route once., Disp: , Rfl:    ibuprofen (ADVIL) 200 MG tablet, Take 200 mg by mouth every 6 (six) hours as needed., Disp: , Rfl:   Observations/Objective: Patient is well-developed, well-nourished in no acute distress.  Resting comfortably at home.  Head is normocephalic, atraumatic.  No labored breathing. Speech is clear and coherent with logical content.  Patient is alert and oriented at baseline.   Assessment and Plan: 1. Acute non-recurrent frontal sinusitis (Primary) - predniSONE (STERAPRED UNI-PAK 21 TAB) 10 MG (21) TBPK tablet; Take following package directions  Dispense: 21 tablet; Refill: 0 - fluticasone (FLONASE) 50 MCG/ACT nasal spray; Place 2 sprays into both nostrils daily.  Dispense: 16 g; Refill: 0 - doxycycline (VIBRA-TABS) 100 MG tablet; Take 1 tablet (100 mg total) by mouth 2 (two) times daily.  Dispense: 20 tablet; Refill: 0  Rx Doxycycline.  Increase fluids.  Rest.  Saline nasal spray.  Probiotic.  Mucinex as directed.  Humidifier in bedroom. Flonase and Sterapred per orders.  Call or return to clinic if symptoms are not improving.   2. Other migraine without status migrainosus, not intractable - predniSONE (STERAPRED UNI-PAK 21 TAB) 10 MG (21) TBPK tablet; Take following package directions  Dispense: 21 tablet; Refill: 0  Headaches seem consistent with migraine of unknown trigger. The prednisone giving currently for sinus inflammation will also help to break her headache cycle but discussed she needs in person evaluation for formal diagnosis and ongoing management.   Follow Up Instructions: I discussed the assessment and treatment plan with the patient. The patient was provided an opportunity to ask questions and all were answered. The patient agreed with the plan and demonstrated an understanding of the instructions.  A copy of instructions were sent to the patient via MyChart unless otherwise  noted below.   The patient was advised to call back or seek an in-person evaluation if the symptoms worsen or if the condition fails to improve as anticipated.    Piedad Climes, PA-C

## 2023-11-26 NOTE — Patient Instructions (Signed)
 Alicia Craig, thank you for joining Piedad Climes, PA-C for today's virtual visit.  While this provider is not your primary care provider (PCP), if your PCP is located in our provider database this encounter information will be shared with them immediately following your visit.   A [facility] MyChart account gives you access to today's visit and all your visits, tests, and labs performed at [facility] " click here if you don't have a [facility] MyChart account or go to [email or web address]  Consent: (Patient) Alicia Craig provided verbal consent for this virtual visit at the beginning of the encounter.  Current Medications:  Current Outpatient Medications:    amoxicillin-clavulanate (AUGMENTIN) 875-125 MG tablet, Take 1 tablet by mouth every 12 (twelve) hours., Disp: 14 tablet, Rfl: 0   benzonatate (TESSALON) 100 MG capsule, Take 1 capsule (100 mg total) by mouth every 8 (eight) hours., Disp: 21 capsule, Rfl: 0   cetirizine (ZYRTEC) 10 MG tablet, Take 1 tablet (10 mg total) by mouth daily., Disp: 30 tablet, Rfl: 0   etonogestrel (NEXPLANON) 68 MG IMPL implant, 1 each by Subdermal route once., Disp: , Rfl:    fluticasone (FLONASE) 50 MCG/ACT nasal spray, Place 2 sprays into both nostrils daily for 14 days., Disp: 16 g, Rfl: 0   ibuprofen (ADVIL) 200 MG tablet, Take 200 mg by mouth every 6 (six) hours as needed., Disp: , Rfl:    Medications ordered in this encounter:  No orders of the defined types were placed in this encounter.    *If you need refills on other medications prior to your next appointment, please contact your pharmacy*  Follow-Up: Call back or seek an in-person evaluation if the symptoms worsen or if the condition fails to improve as anticipated.  [facility] [phone]  Other Instructions Please take antibiotic as directed.  Increase fluid intake.  Use Saline nasal spray.  Take a daily multivitamin. Use the other  prescribed medications as directed.  Place a humidifier in the bedroom.  Please call or return clinic if symptoms are not improving.  Sinusitis Sinusitis is redness, soreness, and swelling (inflammation) of the paranasal sinuses. Paranasal sinuses are air pockets within the bones of your face (beneath the eyes, the middle of the forehead, or above the eyes). In healthy paranasal sinuses, mucus is able to drain out, and air is able to circulate through them by way of your nose. However, when your paranasal sinuses are inflamed, mucus and air can become trapped. This can allow bacteria and other germs to grow and cause infection. Sinusitis can develop quickly and last only a short time (acute) or continue over a long period (chronic). Sinusitis that lasts for more than 12 weeks is considered chronic.  CAUSES  Causes of sinusitis include: Allergies. Structural abnormalities, such as displacement of the cartilage that separates your nostrils (deviated septum), which can decrease the air flow through your nose and sinuses and affect sinus drainage. Functional abnormalities, such as when the small hairs (cilia) that line your sinuses and help remove mucus do not work properly or are not present. SYMPTOMS  Symptoms of acute and chronic sinusitis are the same. The primary symptoms are pain and pressure around the affected sinuses. Other symptoms include: Upper toothache. Earache. Headache. Bad breath. Decreased sense of smell and taste. A cough, which worsens when you are lying flat. Fatigue. Fever. Thick drainage from your nose, which often is green and may contain pus (purulent). Swelling and warmth over the  affected sinuses. DIAGNOSIS  Your caregiver will perform a physical exam. During the exam, your caregiver may: Look in your nose for signs of abnormal growths in your nostrils (nasal polyps). Tap over the affected sinus to check for signs of infection. View the inside of your sinuses  (endoscopy) with a special imaging device with a light attached (endoscope), which is inserted into your sinuses. If your caregiver suspects that you have chronic sinusitis, one or more of the following tests may be recommended: Allergy tests. Nasal culture A sample of mucus is taken from your nose and sent to a lab and screened for bacteria. Nasal cytology A sample of mucus is taken from your nose and examined by your caregiver to determine if your sinusitis is related to an allergy. TREATMENT  Most cases of acute sinusitis are related to a viral infection and will resolve on their own within 10 days. Sometimes medicines are prescribed to help relieve symptoms (pain medicine, decongestants, nasal steroid sprays, or saline sprays).  However, for sinusitis related to a bacterial infection, your caregiver will prescribe antibiotic medicines. These are medicines that will help kill the bacteria causing the infection.  Rarely, sinusitis is caused by a fungal infection. In theses cases, your caregiver will prescribe antifungal medicine. For some cases of chronic sinusitis, surgery is needed. Generally, these are cases in which sinusitis recurs more than 3 times per year, despite other treatments. HOME CARE INSTRUCTIONS  Drink plenty of water. Water helps thin the mucus so your sinuses can drain more easily. Use a humidifier. Inhale steam 3 to 4 times a day (for example, sit in the bathroom with the shower running). Apply a warm, moist washcloth to your face 3 to 4 times a day, or as directed by your caregiver. Use saline nasal sprays to help moisten and clean your sinuses. Take over-the-counter or prescription medicines for pain, discomfort, or fever only as directed by your caregiver. SEEK IMMEDIATE MEDICAL CARE IF: You have increasing pain or severe headaches. You have nausea, vomiting, or drowsiness. You have swelling around your face. You have vision problems. You have a stiff neck. You have  difficulty breathing. MAKE SURE YOU:  Understand these instructions. Will watch your condition. Will get help right away if you are not doing well or get worse. Document Released: 08/04/2005 Document Revised: 10/27/2011 Document Reviewed: 08/19/2011 [facility] Patient Information 2014 [place], [place].    If you have been instructed to have an in-person evaluation today at a local Urgent Care facility, please use the link below. It will take you to a list of all of our available [facility], including address, phone number and hours of operation. Please do not delay care.  [facility]  If you or a family member do not have a primary care provider, use the link below to schedule a visit and establish care. When you choose a [facility] primary care physician or advanced practice provider, you gain a long-term partner in health. Find a Primary Care Provider  Learn more about [facility]'s in-office and virtual care options: [place] [facility] - Get Care Now

## 2024-01-20 ENCOUNTER — Ambulatory Visit: Admitting: Internal Medicine

## 2024-04-08 ENCOUNTER — Ambulatory Visit: Admitting: Family

## 2024-06-01 ENCOUNTER — Telehealth: Payer: Self-pay | Admitting: Physician Assistant

## 2024-06-01 DIAGNOSIS — Z5321 Procedure and treatment not carried out due to patient leaving prior to being seen by health care provider: Secondary | ICD-10-CM

## 2024-06-01 NOTE — Progress Notes (Signed)
 Patient arrived at 6:31pm for a 6:45pm appt Provider logged into appt at 6:45 but patient had logged out of video, or been logged out Provider sent 4 links and waited until 6:58pm and patient never returned or attempted to log back in.   No Charge.

## 2024-08-23 ENCOUNTER — Telehealth: Payer: Self-pay | Admitting: Family Medicine

## 2024-08-23 DIAGNOSIS — G43809 Other migraine, not intractable, without status migrainosus: Secondary | ICD-10-CM

## 2024-08-23 DIAGNOSIS — R110 Nausea: Secondary | ICD-10-CM

## 2024-08-23 MED ORDER — RIZATRIPTAN BENZOATE 10 MG PO TABS: 10.0000 mg | ORAL_TABLET | ORAL | 0 refills | Status: AC | PRN

## 2024-08-23 MED ORDER — ONDANSETRON 4 MG PO TBDP: 4.0000 mg | ORAL_TABLET | Freq: Three times a day (TID) | ORAL | 0 refills | Status: AC | PRN

## 2024-08-23 NOTE — Patient Instructions (Addendum)
 " Alicia Craig, thank you for joining Chiquita CHRISTELLA Barefoot, NP for today's virtual visit.  While this provider is not your primary care provider (PCP), if your PCP is located in our provider database this encounter information will be shared with them immediately following your visit.   A [facility] MyChart account gives you access to today's visit and all your visits, tests, and labs performed at [facility]  click here if you don't have a [name] MyChart account or go to [email or web address]  Consent: (Patient) Alicia Craig provided verbal consent for this virtual visit at the beginning of the encounter.  Current Medications:  Current Outpatient Medications:    ondansetron  (ZOFRAN -ODT) 4 MG disintegrating tablet, Take 1 tablet (4 mg total) by mouth every 8 (eight) hours as needed for nausea or vomiting., Disp: 20 tablet, Rfl: 0   rizatriptan  (MAXALT ) 10 MG tablet, Take 1 tablet (10 mg total) by mouth as needed for migraine. May repeat in 2 hours if needed, Disp: 10 tablet, Rfl: 0   cetirizine  (ZYRTEC ) 10 MG tablet, Take 1 tablet (10 mg total) by mouth daily., Disp: 30 tablet, Rfl: 0   doxycycline  (VIBRA -TABS) 100 MG tablet, Take 1 tablet (100 mg total) by mouth 2 (two) times daily., Disp: 20 tablet, Rfl: 0   etonogestrel  (NEXPLANON ) 68 MG IMPL implant, 1 each by Subdermal route once., Disp: , Rfl:    fluticasone  (FLONASE ) 50 MCG/ACT nasal spray, Place 2 sprays into both nostrils daily., Disp: 16 g, Rfl: 0   ibuprofen  (ADVIL ) 200 MG tablet, Take 200 mg by mouth every 6 (six) hours as needed., Disp: , Rfl:    predniSONE  (STERAPRED UNI-PAK 21 TAB) 10 MG (21) TBPK tablet, Take following package directions, Disp: 21 tablet, Rfl: 0   Medications ordered in this encounter:  Meds ordered this encounter  Medications   rizatriptan  (MAXALT ) 10 MG tablet    Sig: Take 1 tablet (10 mg total) by mouth as needed for migraine. May repeat in 2 hours if needed    Dispense:  10  tablet    Refill:  0    Supervising Provider:   LAMPTEY, PHILIP O [[id number]]   ondansetron  (ZOFRAN -ODT) 4 MG disintegrating tablet    Sig: Take 1 tablet (4 mg total) by mouth every 8 (eight) hours as needed for nausea or vomiting.    Dispense:  20 tablet    Refill:  0    Supervising Provider:   LAMPTEY, PHILIP O [[id number]]     *If you need refills on other medications prior to your next appointment, please contact your pharmacy*  Follow-Up: Call back or seek an in-person evaluation if the symptoms worsen or if the condition fails to improve as anticipated.  [facility] [phone]  Other Instructions  Migraine Headache A migraine headache is a very strong throbbing pain on one or both sides of your head. This type of headache can also cause other symptoms. It can last from 4 hours to 3 days. Talk with your doctor about what things may bring on (trigger) this condition. What are the causes? The exact cause of a migraine is not known. This condition may be brought on or caused by: Smoking. Medicines, such as: Medicine used to treat chest pain (nitroglycerin). Birth control pills. Estrogen. Some blood pressure medicines. Certain substances in some foods or drinks. Foods and drinks, such as: Cheese. Chocolate. Alcohol. Caffeine. Doing physical activity that is very hard. Other things that may trigger a migraine  headache include: Periods. Pregnancy. Hunger. Stress. Getting too much or too little sleep. Weather changes. Feeling tired (fatigue). What increases the risk? Being 48-57 years old. Being female. Having a family history of migraine headaches. Being Caucasian. Having a mental health condition, such as being sad (depressed) or feeling worried or nervous (anxious). Being very overweight (obese). What are the signs or symptoms? A throbbing pain. This pain may: Happen in any area of the head, such as on one or both sides. Make it hard to do daily  activities. Get worse with physical activity. Get worse around bright lights, loud noises, or smells. Other symptoms may include: Feeling like you may vomit (nauseous). Vomiting. Dizziness. Before a migraine headache starts, you may get warning signs (an aura). An aura may include: Seeing flashing lights or having blind spots. Seeing bright spots, halos, or zigzag lines. Having tunnel vision or blurred vision. Having numbness or a tingling feeling. Having trouble talking. Having weak muscles. After a migraine ends, you may have symptoms. These may include: Tiredness. Trouble thinking (concentrating). How is this treated? Taking medicines that: Relieve pain. Relieve the feeling like you may vomit. Prevent migraine headaches. Treatment may also include: Acupuncture. Lifestyle changes like avoiding foods that bring on migraine headaches. Learning ways to control your body functions (biofeedback). Therapy to help you know and deal with negative thoughts (cognitive behavioral therapy). Follow these instructions at home: Medicines Take over-the-counter and prescription medicines only as told by your doctor. If told, take steps to prevent problems with pooping (constipation). You may need to: Drink enough fluid to keep your pee (urine) pale yellow. Take medicines. You will be told what medicines to take. Eat foods that are high in fiber. These include beans, whole grains, and fresh fruits and vegetables. Limit foods that are high in fat and sugar. These include fried or sweet foods. Ask your doctor if you should avoid driving or using machines while you are taking your medicine. Lifestyle  Do not drink alcohol. Do not smoke or use any products that contain nicotine or tobacco. If you need help quitting, ask your doctor. Get 7-9 hours of sleep each night, or the amount recommended by your doctor. Find ways to deal with stress, such as meditation, deep breathing, or yoga. Try to  exercise often. This can help lessen how bad and how often your migraines happen. General instructions Keep a journal to find out what may bring on your migraine headaches. This can help you avoid those things. For example, write down: What you eat and drink. How much sleep you get. Any change to your medicines or diet. If you have a migraine headache: Avoid things that make your symptoms worse, such as bright lights. Lie down in a dark, quiet room. Do not drive or use machinery. Ask your doctor what activities are safe for you. Where to find more information Coalition for Headache and Migraine Patients (CHAMP): headachemigraine.org American Migraine Foundation: americanmigrainefoundation.org National Headache Foundation: headaches.org Contact a doctor if: You get a migraine headache that is different or worse than others you have had. You have more than 15 days of headaches in one month. Get help right away if: Your migraine headache gets very bad. Your migraine headache lasts more than 72 hours. You have a fever or stiff neck. You have trouble seeing. Your muscles feel weak or like you cannot control them. You lose your balance a lot. You have trouble walking. You faint. You have a seizure. This information is not intended to  replace advice given to you by your health care provider. Make sure you discuss any questions you have with your health care provider. Document Revised: 03/31/2022 Document Reviewed: 03/31/2022 Elsevier Patient Education  2024 Elsevier Inc.    If you have been instructed to have an in-person evaluation today at a local Urgent Care facility, please use the link below. It will take you to a list of all of our available [facility], including address, phone number and hours of operation. Please do not delay care.  [facility]  If you or a family member do not have a primary care provider, use the link below to schedule a visit and  establish care. When you choose a [facility] primary care physician or advanced practice provider, you gain a long-term partner in health. Find a Primary Care Provider  Learn more about [facility]'s in-office and virtual care options: [place] [facility] [place] - Get Care Now  "

## 2024-08-23 NOTE — Progress Notes (Signed)
 " Virtual Visit Consent   Alicia Craig, you are scheduled for a virtual visit with a [facility] provider today. Just as with appointments in the office, your consent must be obtained to participate. Your consent will be active for this visit and any virtual visit you may have with one of our providers in the next 365 days. If you have a MyChart account, a copy of this consent can be sent to you electronically.  As this is a virtual visit, video technology does not allow for your provider to perform a traditional examination. This may limit your provider's ability to fully assess your condition. If your provider identifies any concerns that need to be evaluated in person or the need to arrange testing (such as labs, EKG, etc.), we will make arrangements to do so. Although advances in technology are sophisticated, we cannot ensure that it will always work on either your end or our end. If the connection with a video visit is poor, the visit may have to be switched to a telephone visit. With either a video or telephone visit, we are not always able to ensure that we have a secure connection.  By engaging in this virtual visit, you consent to the provision of healthcare and authorize for your insurance to be billed (if applicable) for the services provided during this visit. Depending on your insurance coverage, you may receive a charge related to this service.  I need to obtain your verbal consent now. Are you willing to proceed with your visit today? KATRICIA PREHN has provided verbal consent on 08/23/2024 for a virtual visit (video or telephone). Chiquita CHRISTELLA Barefoot, NP  Date: 08/23/2024 12:19 PM   Virtual Visit via Video Note   I, Chiquita CHRISTELLA Barefoot, connected with  MARSENA TAFF  ([id number], August 28, 1990) on 08/23/2024 at 12:15 PM EST by a video-enabled telemedicine application and verified that I am speaking with the correct person using two identifiers.  Location: Patient: Virtual Visit  Location Patient: Home Provider: Virtual Visit Location Provider: Home Office   I discussed the limitations of evaluation and management by telemedicine and the availability of in person appointments. The patient expressed understanding and agreed to proceed.    History of Present Illness: Alicia Craig is a 34 y.o. who identifies as a female who was assigned female at birth, and is being seen today for headache for last 4 days, no history or treatment of migraines. Currently does not have a PCP at this time.   Onset was 4 days-  reduces her ability to function, reports a bloody nasal drainage, sleepy, light and sound sensitivity, nausea, but no vomiting.  Modifying factors are tylenol  500 mg with zero relief  Denies chest pain, shortness of breath, fevers, chills, URI    Problems:  Patient Active Problem List   Diagnosis Date Noted   Indication for care in labor or delivery 05/26/2019   Vaginal delivery 05/26/2019   Positive GBS test 05/13/2019   Gonorrhea affecting pregnancy in third trimester 05/13/2019   Anemia affecting pregnancy in third trimester 03/18/2019   Supervision of other normal pregnancy, antepartum 10/04/2018   Previous child with Down syndrome, antepartum 05/09/2014    Allergies: Allergies[1] Medications: Current Medications[2]  Observations/Objective: Patient is well-developed, well-nourished in no acute distress.  Resting comfortably  at home.  Head is normocephalic, atraumatic.  No labored breathing.  Speech is clear and coherent with logical content.  Patient is alert and oriented at baseline.   Assessment and  Plan:   1. Other migraine without status migrainosus, not intractable (Primary)  - rizatriptan  (MAXALT ) 10 MG tablet; Take 1 tablet (10 mg total) by mouth as needed for migraine. May repeat in 2 hours if needed  Dispense: 10 tablet; Refill: 0  2. Nausea  - ondansetron  (ZOFRAN -ODT) 4 MG disintegrating tablet; Take 1 tablet (4 mg total) by  mouth every 8 (eight) hours as needed for nausea or vomiting.  Dispense: 20 tablet; Refill: 0   -migraine info on AVS -maxalt  reviewed and discussed -in person if not improving -work note provided -would benefit from PCP - in [place] , [facility] is an option- advised on this    Reviewed side effects, risks and benefits of medication.    Patient acknowledged agreement and understanding of the plan.   Past Medical, Surgical, Social History, Allergies, and Medications have been Reviewed.    Follow Up Instructions: I discussed the assessment and treatment plan with the patient. The patient was provided an opportunity to ask questions and all were answered. The patient agreed with the plan and demonstrated an understanding of the instructions.  A copy of instructions were sent to the patient via MyChart unless otherwise noted below.    The patient was advised to call back or seek an in-person evaluation if the symptoms worsen or if the condition fails to improve as anticipated.    Chiquita CHRISTELLA Barefoot, NP     [1] No Known Allergies [2]  Current Outpatient Medications:    cetirizine  (ZYRTEC ) 10 MG tablet, Take 1 tablet (10 mg total) by mouth daily., Disp: 30 tablet, Rfl: 0   doxycycline  (VIBRA -TABS) 100 MG tablet, Take 1 tablet (100 mg total) by mouth 2 (two) times daily., Disp: 20 tablet, Rfl: 0   etonogestrel  (NEXPLANON ) 68 MG IMPL implant, 1 each by Subdermal route once., Disp: , Rfl:    fluticasone  (FLONASE ) 50 MCG/ACT nasal spray, Place 2 sprays into both nostrils daily., Disp: 16 g, Rfl: 0   ibuprofen  (ADVIL ) 200 MG tablet, Take 200 mg by mouth every 6 (six) hours as needed., Disp: , Rfl:    predniSONE  (STERAPRED UNI-PAK 21 TAB) 10 MG (21) TBPK tablet, Take following package directions, Disp: 21 tablet, Rfl: 0  "
